# Patient Record
Sex: Male | Born: 1965 | Hispanic: Yes | Marital: Married | Smoking: Former smoker
Health system: Southern US, Community
[De-identification: ages and names within clinical notes are randomized; demographics above are authoritative.]

## PROBLEM LIST (undated history)

## (undated) DIAGNOSIS — E119 Type 2 diabetes mellitus without complications: Secondary | ICD-10-CM

## (undated) HISTORY — PX: CARPAL TUNNEL RELEASE: SHX101

---

## 2013-10-20 ENCOUNTER — Ambulatory Visit: Payer: Self-pay | Admitting: Orthopedic Surgery

## 2013-10-22 ENCOUNTER — Ambulatory Visit: Payer: Self-pay | Admitting: Orthopedic Surgery

## 2013-11-03 ENCOUNTER — Ambulatory Visit: Payer: Self-pay | Admitting: Orthopedic Surgery

## 2014-12-14 ENCOUNTER — Emergency Department: Payer: Self-pay | Admitting: Emergency Medicine

## 2014-12-19 ENCOUNTER — Emergency Department: Payer: Self-pay | Admitting: Student

## 2015-02-25 NOTE — Op Note (Signed)
PATIENT NAME:  [name], Ian MR#:  [id number] DATE OF BIRTH:  October 02, 1966  DATE OF PROCEDURE:  10/22/2013  PREOPERATIVE DIAGNOSES: Right carpal tunnel syndrome, right ring trigger finger.   POSTOPERATIVE DIAGNOSES: Right carpal tunnel syndrome, right ring trigger finger.  PROCEDURE: Right carpal tunnel release, right ring trigger finger release.   ANESTHESIA: General.   SURGEON: Leitha SchullerMichael J. Stephonie Wilcoxen, M.D.   DESCRIPTION OF PROCEDURE: The patient was brought to the operating room and after adequate anesthesia was obtained, the right arm was prepped and draped in the usual sterile fashion with a tourniquet applied to the upper arm. After patient identification and timeout procedures were completed, the tourniquet was raised to 250 mmHg. An approximately 2.5 cm incision was made for the carpal tunnel release. Hemostasis being achieved with electrocautery. The transverse carpal ligament was identified and incised with a hemostat placed to protect the underlying structures. Release was carried out distally first and then proximally. In the proximal third of the canal, there appeared to be compression, and after release of this, there was good vascular blush to the nerve. There was moderate flexor tenosynovitis. No masses present. The wound was irrigated, and 10 mL of 0.5% Sensorcaine without epinephrine was infiltrated in the subcutaneous tissues. Next, attention was turned to the ring trigger finger. A 1.5 cm incision made over the A1 pulley. Subcutaneous tissue spread with retractors placed. The A1 pulley was identified. It seemed quite thickened. After release, it was opened with a scalpel first and then going proximal and distal, the finger was placed through a range of motion under direct visualization. There was some swelling of the tendon but no abrasion. There did not appear to be any locking with passive range of motion. Both wounds were irrigated and closed with simple interrupted 4-0 nylon skin sutures.  Xeroform, 4 x 4's, Webril and Ace wrap applied. Tourniquet time was 18 minutes at 250 mmHg. There were no complications. There was no specimen.   ____________________________ Leitha SchullerMichael J. Zeda Gangwer, MD mjm:gb D: 10/22/2013 21:02:59 ET T: 10/22/2013 21:36:04 ET JOB#: [id number]  cc: Leitha SchullerMichael J. Vivienne Sangiovanni, MD, <Dictator> Leitha SchullerMICHAEL J Stefany Starace MD ELECTRONICALLY SIGNED 10/23/2013 8:16

## 2015-02-25 NOTE — Op Note (Signed)
PATIENT NAME:  Ian Mcneil, Ian MR#:  [id number] DATE OF BIRTH:  1966/05/28  DATE OF PROCEDURE:  11/03/2013  PREOPERATIVE DIAGNOSIS: Left carpal tunnel syndrome.   POSTOPERATIVE DIAGNOSIS: Left carpal tunnel syndrome.   PROCEDURE: Left carpal tunnel release    ANESTHESIA: General.   SURGEON: Leitha SchullerMichael J. Duane Earnshaw, M.D.   DESCRIPTION OF PROCEDURE: The patient was brought to the operating room and after adequate anesthesia was obtained, the left arm was prepped and draped in the usual sterile fashion with a tourniquet applied to the upper arm. After patient identification and timeout procedures were completed, the tourniquet raised to 250 mmHg. An approximately 1 inch incision was made in line with the ring metacarpal. Skin and subcutaneous tissue spread and the transverse carpal ligament identified and incised with a hemostat underneath this to protect the underlying structures. Release was carried out distally and then proximally, distally until there was fat noted around the nerve and tendons consistent with lack of compression. Proximally, release was carried out to just proximal to the wrist flexion crease, at which point there was good vascular blush to the nerve. The compression appeared to be in the proximal extent of the carpal tunnel. The wound was irrigated and then the wound infiltrated with 10 mL of 0.5% Sensorcaine without epinephrine to aid in postop analgesia in the subcutaneous tissue. The wound was then closed with simple interrupted 5-0 nylon skin suture. Xeroform, 4 x 4's, Webril and Ace wrap applied. The patient was sent to the recovery room in stable condition.   ESTIMATED BLOOD LOSS: Minimal.   COMPLICATIONS: None.   SPECIMEN: None.   TOURNIQUET TIME: 11 minutes at 250 mmHg.   ____________________________ Leitha SchullerMichael J. Anacristina Steffek, MD mjm:gb D: 11/03/2013 16:37:47 ET T: 11/03/2013 20:04:10 ET JOB#: [id number]  cc: Leitha SchullerMichael J. Gareth Fitzner, MD, <Dictator> Leitha SchullerMICHAEL J Ramona Ruark MD ELECTRONICALLY SIGNED  11/04/2013 12:15

## 2019-12-25 ENCOUNTER — Emergency Department: Payer: BC Managed Care – PPO

## 2019-12-25 ENCOUNTER — Emergency Department
Admission: EM | Admit: 2019-12-25 | Discharge: 2019-12-25 | Disposition: A | Discharge status: Other Acute Inpatient Hospital | Payer: BC Managed Care – PPO | Attending: Internal Medicine | Admitting: Internal Medicine

## 2019-12-25 ENCOUNTER — Other Ambulatory Visit: Payer: Self-pay

## 2019-12-25 ENCOUNTER — Encounter: Payer: Self-pay | Admitting: Emergency Medicine

## 2019-12-25 ENCOUNTER — Encounter (HOSPITAL_COMMUNITY): Payer: Self-pay | Admitting: Internal Medicine

## 2019-12-25 ENCOUNTER — Inpatient Hospital Stay (HOSPITAL_COMMUNITY)
Admission: AD | Admit: 2019-12-25 | Discharge: 2019-12-31 | DRG: 177 | Disposition: A | Discharge status: Home / Self Care | Payer: BC Managed Care – PPO | Source: Other Acute Inpatient Hospital | Attending: Family Medicine | Admitting: Family Medicine

## 2019-12-25 DIAGNOSIS — Z6833 Body mass index (BMI) 33.0-33.9, adult: Secondary | ICD-10-CM

## 2019-12-25 DIAGNOSIS — Z87891 Personal history of nicotine dependence: Secondary | ICD-10-CM

## 2019-12-25 DIAGNOSIS — J9601 Acute respiratory failure with hypoxia: Secondary | ICD-10-CM

## 2019-12-25 DIAGNOSIS — E669 Obesity, unspecified: Secondary | ICD-10-CM | POA: Diagnosis not present

## 2019-12-25 DIAGNOSIS — I10 Essential (primary) hypertension: Secondary | ICD-10-CM | POA: Diagnosis present

## 2019-12-25 DIAGNOSIS — U071 COVID-19: Principal | ICD-10-CM | POA: Diagnosis present

## 2019-12-25 DIAGNOSIS — E871 Hypo-osmolality and hyponatremia: Secondary | ICD-10-CM

## 2019-12-25 DIAGNOSIS — T380X5A Adverse effect of glucocorticoids and synthetic analogues, initial encounter: Secondary | ICD-10-CM | POA: Diagnosis present

## 2019-12-25 DIAGNOSIS — E119 Type 2 diabetes mellitus without complications: Secondary | ICD-10-CM

## 2019-12-25 DIAGNOSIS — J1282 Pneumonia due to coronavirus disease 2019: Secondary | ICD-10-CM | POA: Diagnosis present

## 2019-12-25 DIAGNOSIS — E1165 Type 2 diabetes mellitus with hyperglycemia: Secondary | ICD-10-CM | POA: Insufficient documentation

## 2019-12-25 DIAGNOSIS — D6959 Other secondary thrombocytopenia: Secondary | ICD-10-CM | POA: Diagnosis present

## 2019-12-25 DIAGNOSIS — R0602 Shortness of breath: Secondary | ICD-10-CM | POA: Diagnosis present

## 2019-12-25 HISTORY — DX: Type 2 diabetes mellitus without complications: E11.9

## 2019-12-25 LAB — COMPREHENSIVE METABOLIC PANEL WITH GFR
ALT: 111 U/L — ABNORMAL HIGH (ref 0–44)
AST: 157 U/L — ABNORMAL HIGH (ref 15–41)
Albumin: 3.6 g/dL (ref 3.5–5.0)
Alkaline Phosphatase: 71 U/L (ref 38–126)
Anion gap: 11 (ref 5–15)
BUN: 17 mg/dL (ref 6–20)
CO2: 19 mmol/L — ABNORMAL LOW (ref 22–32)
Calcium: 8 mg/dL — ABNORMAL LOW (ref 8.9–10.3)
Chloride: 97 mmol/L — ABNORMAL LOW (ref 98–111)
Creatinine, Ser: 0.81 mg/dL (ref 0.61–1.24)
GFR calc Af Amer: >60 mL/min
GFR calc non Af Amer: >60 mL/min
Glucose, Bld: 408 mg/dL — ABNORMAL HIGH (ref 70–99)
Potassium: 4.1 mmol/L (ref 3.5–5.1)
Sodium: 127 mmol/L — ABNORMAL LOW (ref 135–145)
Total Bilirubin: 0.8 mg/dL (ref 0.3–1.2)
Total Protein: 7.5 g/dL (ref 6.5–8.1)

## 2019-12-25 LAB — C-REACTIVE PROTEIN: CRP: 5.8 mg/dL — ABNORMAL HIGH (ref <1.0)

## 2019-12-25 LAB — CBC WITH DIFFERENTIAL/PLATELET
Abs Immature Granulocytes: 0.02 10*3/uL (ref 0.00–0.07)
Basophils Absolute: 0 10*3/uL (ref 0.0–0.1)
Basophils Relative: 0 %
Eosinophils Absolute: 0 10*3/uL (ref 0.0–0.5)
Eosinophils Relative: 0 %
HCT: 46.2 % (ref 39.0–52.0)
Hemoglobin: 16.7 g/dL (ref 13.0–17.0)
Immature Granulocytes: 0 %
Lymphocytes Relative: 14 %
Lymphs Abs: 0.6 10*3/uL — ABNORMAL LOW (ref 0.7–4.0)
MCH: 33.1 pg (ref 26.0–34.0)
MCHC: 36.1 g/dL — ABNORMAL HIGH (ref 30.0–36.0)
MCV: 91.5 fL (ref 80.0–100.0)
Monocytes Absolute: 0.3 10*3/uL (ref 0.1–1.0)
Monocytes Relative: 6 %
Neutro Abs: 3.7 10*3/uL (ref 1.7–7.7)
Neutrophils Relative %: 80 %
Platelets: 147 10*3/uL — ABNORMAL LOW (ref 150–400)
RBC: 5.05 MIL/uL (ref 4.22–5.81)
RDW: 12.1 % (ref 11.5–15.5)
WBC: 4.6 10*3/uL (ref 4.0–10.5)
nRBC: 0 % (ref 0.0–0.2)

## 2019-12-25 LAB — URINALYSIS, COMPLETE (UACMP) WITH MICROSCOPIC
Bacteria, UA: NONE SEEN
Bilirubin Urine: NEGATIVE
Glucose, UA: >=500 mg/dL — AB
Hgb urine dipstick: NEGATIVE
Ketones, ur: NEGATIVE mg/dL
Leukocytes,Ua: NEGATIVE
Nitrite: NEGATIVE
Protein, ur: 30 mg/dL — AB
Specific Gravity, Urine: 1.033 — ABNORMAL HIGH (ref 1.005–1.030)
Squamous Epithelial / HPF: NONE SEEN (ref 0–5)
pH: 6 (ref 5.0–8.0)

## 2019-12-25 LAB — TROPONIN I (HIGH SENSITIVITY)
Troponin I (High Sensitivity): 7 ng/L
Troponin I (High Sensitivity): 8 ng/L (ref <18)

## 2019-12-25 LAB — FIBRINOGEN: Fibrinogen: 537 mg/dL — ABNORMAL HIGH (ref 210–475)

## 2019-12-25 LAB — GLUCOSE, CAPILLARY
Glucose-Capillary: 281 mg/dL — ABNORMAL HIGH (ref 70–99)
Glucose-Capillary: 250 mg/dL — ABNORMAL HIGH (ref 70–99)

## 2019-12-25 LAB — BRAIN NATRIURETIC PEPTIDE: B Natriuretic Peptide: 19 pg/mL (ref 0.0–100.0)

## 2019-12-25 LAB — FIBRIN DERIVATIVES D-DIMER (ARMC ONLY): Fibrin derivatives D-dimer (ARMC): 1520.1 ng/mL (FEU) — ABNORMAL HIGH (ref 0.00–499.00)

## 2019-12-25 LAB — PROCALCITONIN: Procalcitonin: 0.35 ng/mL

## 2019-12-25 LAB — LACTIC ACID, PLASMA
Lactic Acid, Venous: 1.9 mmol/L (ref 0.5–1.9)
Lactic Acid, Venous: 1.6 mmol/L (ref 0.5–1.9)

## 2019-12-25 LAB — LACTATE DEHYDROGENASE: LDH: 375 U/L — ABNORMAL HIGH (ref 98–192)

## 2019-12-25 LAB — TRIGLYCERIDES: Triglycerides: 129 mg/dL

## 2019-12-25 LAB — POC SARS CORONAVIRUS 2 AG: SARS Coronavirus 2 Ag: POSITIVE — AB

## 2019-12-25 LAB — FERRITIN: Ferritin: 2986 ng/mL — ABNORMAL HIGH (ref 24–336)

## 2019-12-25 MED ORDER — SODIUM CHLORIDE 0.9 % IV SOLN
1000.0000 mL | INTRAVENOUS | Status: DC
  Administered 2019-12-25: 14:00:00 1000 mL via INTRAVENOUS

## 2019-12-25 MED ORDER — INSULIN ASPART 100 UNIT/ML ~~LOC~~ SOLN: 0.0000 [IU] | Freq: Every day | SUBCUTANEOUS | Status: DC

## 2019-12-25 MED ORDER — IPRATROPIUM-ALBUTEROL 20-100 MCG/ACT IN AERS
1.0000 | INHALATION_SPRAY | Freq: Four times a day (QID) | RESPIRATORY_TRACT | Status: DC
  Administered 2019-12-25 – 2019-12-31 (×23): 1 via RESPIRATORY_TRACT
  Filled 2019-12-25 (×3): qty 4

## 2019-12-25 MED ORDER — DEXAMETHASONE 6 MG PO TABS
6.0000 mg | ORAL_TABLET | ORAL | Status: DC
  Administered 2019-12-26 – 2019-12-31 (×6): 6 mg via ORAL
  Filled 2019-12-25 (×6): qty 1

## 2019-12-25 MED ORDER — ZINC SULFATE 220 (50 ZN) MG PO CAPS
220.0000 mg | ORAL_CAPSULE | Freq: Every day | ORAL | Status: DC
  Administered 2019-12-25 – 2019-12-31 (×7): 220 mg via ORAL
  Filled 2019-12-25 (×7): qty 1

## 2019-12-25 MED ORDER — ENOXAPARIN SODIUM 40 MG/0.4ML ~~LOC~~ SOLN
40.0000 mg | SUBCUTANEOUS | Status: DC
  Administered 2019-12-25 – 2019-12-30 (×6): 40 mg via SUBCUTANEOUS
  Filled 2019-12-25 (×6): qty 0.4

## 2019-12-25 MED ORDER — ACETAMINOPHEN 325 MG PO TABS
650.0000 mg | ORAL_TABLET | Freq: Once | ORAL | Status: AC | PRN
  Administered 2019-12-25: 650 mg via ORAL
  Filled 2019-12-25: qty 2

## 2019-12-25 MED ORDER — ONDANSETRON HCL 4 MG/2ML IJ SOLN: 4.0000 mg | Freq: Four times a day (QID) | INTRAMUSCULAR | Status: DC | PRN

## 2019-12-25 MED ORDER — INSULIN ASPART 100 UNIT/ML ~~LOC~~ SOLN: 0.0000 [IU] | Freq: Three times a day (TID) | SUBCUTANEOUS | Status: DC

## 2019-12-25 MED ORDER — GUAIFENESIN-DM 100-10 MG/5ML PO SYRP
10.0000 mL | ORAL_SOLUTION | ORAL | Status: DC | PRN
  Administered 2019-12-28 – 2019-12-30 (×5): 10 mL via ORAL
  Filled 2019-12-25 (×5): qty 10

## 2019-12-25 MED ORDER — SODIUM CHLORIDE 0.9% FLUSH: 3.0000 mL | Freq: Once | INTRAVENOUS | Status: DC

## 2019-12-25 MED ORDER — INSULIN ASPART 100 UNIT/ML ~~LOC~~ SOLN
0.0000 [IU] | Freq: Three times a day (TID) | SUBCUTANEOUS | Status: DC
  Administered 2019-12-25: 17:00:00 8 [IU] via SUBCUTANEOUS
  Filled 2019-12-25: qty 1

## 2019-12-25 MED ORDER — ACETAMINOPHEN 325 MG PO TABS: 650.0000 mg | ORAL_TABLET | Freq: Four times a day (QID) | ORAL | Status: DC | PRN

## 2019-12-25 MED ORDER — DOCUSATE SODIUM 100 MG PO CAPS
100.0000 mg | ORAL_CAPSULE | Freq: Every day | ORAL | Status: DC
  Administered 2019-12-25 – 2019-12-31 (×7): 100 mg via ORAL
  Filled 2019-12-25 (×7): qty 1

## 2019-12-25 MED ORDER — INSULIN ASPART 100 UNIT/ML ~~LOC~~ SOLN
5.0000 [IU] | Freq: Once | SUBCUTANEOUS | Status: AC
  Administered 2019-12-25: 5 [IU] via INTRAVENOUS
  Filled 2019-12-25: qty 1

## 2019-12-25 MED ORDER — HYDROCOD POLST-CPM POLST ER 10-8 MG/5ML PO SUER
5.0000 mL | Freq: Two times a day (BID) | ORAL | Status: DC | PRN
  Administered 2019-12-26 – 2019-12-30 (×7): 5 mL via ORAL
  Filled 2019-12-25 (×7): qty 5

## 2019-12-25 MED ORDER — DEXAMETHASONE SODIUM PHOSPHATE 10 MG/ML IJ SOLN
10.0000 mg | Freq: Once | INTRAMUSCULAR | Status: AC
  Administered 2019-12-25: 13:00:00 10 mg via INTRAVENOUS
  Filled 2019-12-25: qty 1

## 2019-12-25 MED ORDER — ONDANSETRON HCL 4 MG PO TABS: 4.0000 mg | ORAL_TABLET | Freq: Four times a day (QID) | ORAL | Status: DC | PRN

## 2019-12-25 MED ORDER — SODIUM CHLORIDE 0.9 % IV SOLN
100.0000 mg | Freq: Every day | INTRAVENOUS | Status: AC
  Administered 2019-12-26 – 2019-12-29 (×4): 100 mg via INTRAVENOUS
  Filled 2019-12-25 (×4): qty 20

## 2019-12-25 MED ORDER — SODIUM CHLORIDE 0.9 % IV SOLN
100.0000 mg | Freq: Every day | INTRAVENOUS | Status: DC
  Filled 2019-12-25: qty 20

## 2019-12-25 MED ORDER — ASCORBIC ACID 500 MG PO TABS
500.0000 mg | ORAL_TABLET | Freq: Every day | ORAL | Status: DC
  Administered 2019-12-25 – 2019-12-31 (×7): 500 mg via ORAL
  Filled 2019-12-25 (×6): qty 1

## 2019-12-25 MED ORDER — SODIUM CHLORIDE 0.9 % IV SOLN
200.0000 mg | Freq: Once | INTRAVENOUS | Status: AC
  Administered 2019-12-25: 14:00:00 200 mg via INTRAVENOUS
  Filled 2019-12-25: qty 200

## 2019-12-25 NOTE — ED Notes (Signed)
Admitting MD is at the bedside.  Tele translator brought to bed also

## 2019-12-25 NOTE — Discharge Summary (Signed)
  Please see HNP for full details. DO Discharge 12/25/2019  Discharge diagnosis acute respiratory failure secondary to COVID-19 pneumonia hyperglycemia/type II diabetes uncontrolled Hyponatremia elevated LFTs  Patient has been transferred to [facility] for further management of COVID-19 pneumonia.  Patient is in agreement with th plan. Further management per [facility] physicians.

## 2019-12-25 NOTE — Progress Notes (Signed)
Report given to [facility], report called to [facility]. Patient updated on plan of care. In NAD at this time.

## 2019-12-25 NOTE — ED Provider Notes (Signed)
[facility] Emergency Department Provider Note    None    (approximate)  I have reviewed the triage vital signs and the nursing notes.   HISTORY  Chief Complaint Chest Pain    HPI Ian Mcneil is a 54 y.o. male no significant past medical history but does have a self-reported history of hyper tension.  Presents to the ER for evaluation of cough chest pain shortness of breath and fevers for the past 3 days.  Patient arrives to the ER febrile tachycardic hypoxic with dry nonproductive cough.  Does not have any known sick contacts.  No recent antibiotics.  Denies any nausea or vomiting.   History reviewed. No pertinent past medical history. No family history on file. Past Surgical History:  Procedure Laterality Date  . CARPAL TUNNEL RELEASE     There are no problems to display for this patient.     Prior to Admission medications   Not on File    Allergies Patient has no known allergies.    Social History Social History   Tobacco Use  . Smoking status: Former Research scientist (life sciences)  . Smokeless tobacco: Never Used  Substance Use Topics  . Alcohol use: Not Currently  . Drug use: Not Currently    Review of Systems Patient denies headaches, rhinorrhea, blurry vision, numbness, shortness of breath, chest pain, edema, cough, abdominal pain, nausea, vomiting, diarrhea, dysuria, fevers, rashes or hallucinations unless otherwise stated above in HPI. ____________________________________________   PHYSICAL EXAM:  VITAL SIGNS: Vitals:   12/25/19 1216 12/25/19 1300  BP:  124/77  Pulse:  92  Resp:    Temp:    SpO2: 92% 92%    Constitutional: Alert and oriented. Mild resp distress Eyes: Conjunctivae are normal.  Head: Atraumatic. Nose: No congestion/rhinnorhea. Mouth/Throat: Mucous membranes are moist.   Neck: No stridor. Painless ROM.  Cardiovascular: tachycardic rate, regular rhythm. Grossly normal heart sounds.  Good peripheral  circulation. Respiratory: Normal respiratory effort.  No retractions. Lungs CTAB. Gastrointestinal: Soft and nontender. No distention. No abdominal bruits. No CVA tenderness. Genitourinary:  Musculoskeletal: No lower extremity tenderness nor edema.  No joint effusions. Neurologic:  Normal speech and language. No gross focal neurologic deficits are appreciated. No facial droop Skin:  Skin is warm, dry and intact. No rash noted. Psychiatric: Mood and affect are normal. Speech and behavior are normal.  ____________________________________________   LABS (all labs ordered are listed, but only abnormal results are displayed)  Results for orders placed or performed during the hospital encounter of 12/25/19 (from the past 24 hour(s))  Lactic acid, plasma     Status: None   Collection Time: 12/25/19 12:12 PM  Result Value Ref Range   Lactic Acid, Venous 1.9 0.5 - 1.9 mmol/L  Comprehensive metabolic panel     Status: Abnormal   Collection Time: 12/25/19 12:12 PM  Result Value Ref Range   Sodium 127 (L) 135 - 145 mmol/L   Potassium 4.1 3.5 - 5.1 mmol/L   Chloride 97 (L) 98 - 111 mmol/L   CO2 19 (L) 22 - 32 mmol/L   Glucose, Bld 408 (H) 70 - 99 mg/dL   BUN 17 6 - 20 mg/dL   Creatinine, Ser 0.81 0.61 - 1.24 mg/dL   Calcium 8.0 (L) 8.9 - 10.3 mg/dL   Total Protein 7.5 6.5 - 8.1 g/dL   Albumin 3.6 3.5 - 5.0 g/dL   AST 157 (H) 15 - 41 U/L   ALT 111 (H) 0 - 44 U/L  Alkaline Phosphatase 71 38 - 126 U/L   Total Bilirubin 0.8 0.3 - 1.2 mg/dL   GFR calc non Af Amer >60 >60 mL/min   GFR calc Af Amer >60 >60 mL/min   Anion gap 11 5 - 15  CBC with Differential     Status: Abnormal   Collection Time: 12/25/19 12:12 PM  Result Value Ref Range   WBC 4.6 4.0 - 10.5 K/uL   RBC 5.05 4.22 - 5.81 MIL/uL   Hemoglobin 16.7 13.0 - 17.0 g/dL   HCT 09.3 23.5 - 57.3 %   MCV 91.5 80.0 - 100.0 fL   MCH 33.1 26.0 - 34.0 pg   MCHC 36.1 (H) 30.0 - 36.0 g/dL   RDW 22.0 25.4 - 27.0 %   Platelets 147 (L) 150 -  400 K/uL   nRBC 0.0 0.0 - 0.2 %   Neutrophils Relative % 80 %   Neutro Abs 3.7 1.7 - 7.7 K/uL   Lymphocytes Relative 14 %   Lymphs Abs 0.6 (L) 0.7 - 4.0 K/uL   Monocytes Relative 6 %   Monocytes Absolute 0.3 0.1 - 1.0 K/uL   Eosinophils Relative 0 %   Eosinophils Absolute 0.0 0.0 - 0.5 K/uL   Basophils Relative 0 %   Basophils Absolute 0.0 0.0 - 0.1 K/uL   Immature Granulocytes 0 %   Abs Immature Granulocytes 0.02 0.00 - 0.07 K/uL  Troponin I (High Sensitivity)     Status: None   Collection Time: 12/25/19 12:12 PM  Result Value Ref Range   Troponin I (High Sensitivity) 7 <18 ng/L   ____________________________________________  EKG My review and personal interpretation at Time: 12:07   Indication: sob  Rate: 125  Rhythm: sinus Axis: normal  Other: normal intervals, no stemi ____________________________________________  RADIOLOGY  I personally reviewed all radiographic images ordered to evaluate for the above acute complaints and reviewed radiology reports and findings.  These findings were personally discussed with the patient.  Please see medical record for radiology report.  ____________________________________________   PROCEDURES  Procedure(s) performed:  .Critical Care Performed by: Willy Eddy, MD Authorized by: Willy Eddy, MD   Critical care provider statement:    Critical care time (minutes):  35   Critical care time was exclusive of:  Separately billable procedures and treating other patients   Critical care was necessary to treat or prevent imminent or life-threatening deterioration of the following conditions:  Respiratory failure   Critical care was time spent personally by me on the following activities:  Development of treatment plan with patient or surrogate, discussions with consultants, evaluation of patient's response to treatment, examination of patient, obtaining history from patient or surrogate, ordering and performing treatments and  interventions, ordering and review of laboratory studies, ordering and review of radiographic studies, pulse oximetry, re-evaluation of patient's condition and review of old charts      Critical Care performed: yes ____________________________________________   INITIAL IMPRESSION / ASSESSMENT AND PLAN / ED COURSE  Pertinent labs & imaging results that were available during my care of the patient were reviewed by me and considered in my medical decision making (see chart for details).   DDX: Asthma, copd, COVID CHF, pna, ptx, malignancy, Pe, anemia   Darryl Willner is a 54 y.o. who presents to the ED with symptoms as described above febrile tachycardic hypoxic and in mild respiratory distress requiring supplemental oxygen.  Work-up does show evidence of COVID-19 infection.  Patient will require hospitalization given the acuity of his illness.  Have given Decadron as well as gentle IV fluids.  Have discussed with the patient and available family all diagnostics and treatments performed thus far and all questions were answered to the best of my ability. The patient demonstrates understanding and agreement with plan.      The patient was evaluated in Emergency Department today for the symptoms described in the history of present illness. He/she was evaluated in the context of the global COVID-19 pandemic, which necessitated consideration that the patient might be at risk for infection with the SARS-CoV-2 virus that causes COVID-19. Institutional protocols and algorithms that pertain to the evaluation of patients at risk for COVID-19 are in a state of rapid change based on information released by regulatory bodies including the CDC and federal and state organizations. These policies and algorithms were followed during the patient's care in the ED.  As part of my medical decision making, I reviewed the following data within the electronic MEDICAL RECORD NUMBER Nursing notes reviewed and incorporated, Labs  reviewed, notes from prior ED visits and [place] Controlled Substance Database   ____________________________________________   FINAL CLINICAL IMPRESSION(S) / ED DIAGNOSES  Final diagnoses:  Acute respiratory failure with hypoxia (HCC)      NEW MEDICATIONS STARTED DURING THIS VISIT:  New Prescriptions   No medications on file     Note:  This document was prepared using Dragon voice recognition software and may include unintentional dictation errors.    Willy Eddy, MD 12/25/19 1321

## 2019-12-25 NOTE — ED Triage Notes (Signed)
Pt presents to ED via POV with c/o CP and cough. Pt with noted strong cough in triage. Pt also c/o his pulse upon arrival to ED. Pt states he feels like his heart is racing.   Pt noted to be tachypneic and febrile upon arrival to ED.

## 2019-12-25 NOTE — H&P (Signed)
TRH H&P   Patient Demographics:    Ian Mcneil, is a 54 y.o. male  MRN: [id number]   DOB - February 05, 1966  Admit Date - 12/25/2019  Outpatient Primary MD for the patient is Patient, No Pcp Per  Patient coming from: [facility]  No chief complaint on file.     HPI:    Ian Mcneil  is a 54 y.o. male, with HTN, DM, obesity who presented to the [facility] with progressive shortness of breath, found hypoxic with sats in the 80s, started on supplemental oxygen and subsequently transferred to [facility] for management of respiratory failure with hypoxia secondary to SARS-CoV-2.  He was doing well until a few days ago he developed upper respiratory symptoms including cough, fever, generalized malaise and weakness.  Symptoms progressively worsened to include shortness of breath with minimal exertion prompting the ER presentation.  Cough is nonproductive.  He denies any orthopnea, PND, palpitations.  Currently not taking any medications as an outpatient.   Review of systems:  Review of Systems:  Constitutional: negative for anorexia, chills, fatigue or fevers HEENT: negative for earaches, epistaxis, or sore throat Respiratory: see HPI Cardiovascular: negative for chest pain, palpitations, or syncope GU: negative for dysuria, urinary frequency, urinary urgency, hematuria Gastrointestinal: negative for abdominal pain, constipation, diarrhea, nausea or vomiting Musculoskeletal: negative for arthralgias, back pain or myalgias Neurological: negative for dizziness, headaches or weakness Behavioral/Psych: negative for suicidal or homicidal ideation Skin:negative for rash Heme: negative for bruises Endo: negative for hair loss, weight gain/loss  With  Past History of the following :   Past Medical History:  Diagnosis Date  . Diabetes mellitus ([place])       Past Surgical History:  Procedure Laterality Date  . CARPAL TUNNEL RELEASE      Social History:   Social History   Tobacco Use  . Smoking status: Former Research scientist (life sciences)  . Smokeless tobacco: Never Used  Substance Use Topics  . Alcohol use: Not Currently     Family History :    History reviewed. No pertinent family history.   Home Medications:   Prior to Admission medications   Not on File     Allergies:    No Known Allergies   Physical Exam:   Vitals  Blood pressure 108/75, pulse 73, temperature 97.7 F (36.5 C), temperature source Oral, resp. rate 19,  height 5\' 3"  (1.6 m), weight 84.8 kg, SpO2 95 %.  Physical Exam   Constitutional - resting comfortably, no acute distress Eyes - pupils equal round and reactive to light and accomodation, extra ocular movements intact Nose - no gross deformity or drainage Mouth - no oral lesions noted Throat - no swelling or erythema Neck - supple, no JVD   CV - (+)S1S2, no murmurs  Resp -few crackles at the bases otherwise CTA bilaterally, no wheezing or crackles,  GI - (+)BS, soft, non-tender, non-distended Extrem - no clubbing, cyanosis, or peripheral edema Skin - no rashes or wounds Neuro - alert, aware, oriented to person/place/time  Psych - normal affect, no anxiety   Patient has Pressure Ulcer on Admission?: no   Data Review:    CBC Recent Labs  Lab 12/25/19 1212  WBC 4.6  HGB 16.7  HCT 46.2  PLT 147*  MCV 91.5  MCH 33.1  MCHC 36.1*  RDW 12.1  LYMPHSABS 0.6*  MONOABS 0.3  EOSABS 0.0  BASOSABS 0.0   ------------------------------------------------------------------------------------------------------------------  Chemistries  Recent Labs  Lab 12/25/19 1212  NA 127*  K 4.1  CL 97*  CO2 19*  GLUCOSE 408*  BUN 17  CREATININE 0.81  CALCIUM 8.0*  AST 157*  ALT 111*  ALKPHOS 71  BILITOT 0.8    ------------------------------------------------------------------------------------------------------------------ estimated creatinine clearance is 100.4 mL/min (by C-G formula based on SCr of 0.81 mg/dL). ------------------------------------------------------------------------------------------------------------------ No results for input(s): TSH, T4TOTAL, T3FREE, THYROIDAB in the last 72 hours.  Invalid input(s): FREET3  Coagulation profile No results for input(s): INR, PROTIME in the last 168 hours. ------------------------------------------------------------------------------------------------------------------- No results for input(s): DDIMER in the last 72 hours. -------------------------------------------------------------------------------------------------------------------  Cardiac Enzymes No results for input(s): CKMB, TROPONINI, MYOGLOBIN in the last 168 hours.  Invalid input(s): CK ------------------------------------------------------------------------------------------------------------------    Component Value Date/Time   BNP 19.0 12/25/2019 1327     ---------------------------------------------------------------------------------------------------------------  Urinalysis    Component Value Date/Time   COLORURINE YELLOW (A) 12/25/2019 1326   APPEARANCEUR CLEAR (A) 12/25/2019 1326   LABSPEC 1.033 (H) 12/25/2019 1326   PHURINE 6.0 12/25/2019 1326   GLUCOSEU >=500 (A) 12/25/2019 1326   HGBUR NEGATIVE 12/25/2019 1326   BILIRUBINUR NEGATIVE 12/25/2019 1326   KETONESUR NEGATIVE 12/25/2019 1326   PROTEINUR 30 (A) 12/25/2019 1326   NITRITE NEGATIVE 12/25/2019 1326   LEUKOCYTESUR NEGATIVE 12/25/2019 1326    ----------------------------------------------------------------------------------------------------------------   Imaging Results:    DG Chest 2 View  Result Date: 12/25/2019 CLINICAL DATA:  Chest pain and cough. EXAM: CHEST - 2 VIEW COMPARISON:  None.  FINDINGS: Lung volumes are low crowding of bronchovascular structures. Patchy airspace disease is present in the lung bases. Heart size is normal. No pneumothorax or pleural fluid. No acute or focal bony abnormality. IMPRESSION: Patchy bibasilar airspace disease could be due to atelectasis or pneumonia. Electronically Signed   By: 12/27/2019 M.D.   On: 12/25/2019 12:53     Assessment & Plan:    Principal Problem:   Acute hypoxemic respiratory failure due to severe acute respiratory syndrome coronavirus 2 (SARS-CoV-2) disease (HCC) Active Problems:   Uncontrolled diabetes mellitus with hyperglycemia (HCC)   Hyponatremia   Obesity (BMI 30.0-34.9)     Acute hypoxemic respiratory failure due to SARS-CoV-2 disease: Patient presented with progressive shortness of breath, generalized malaise, was found to be hypoxic with O2 sat of 88% on room air, tested positive for SARS-CoV-2, subsequently started on supplemental oxygen and transferred for further management. Date of Dx: 12/25/2019 Oxygen requirements: 4  LPM Antibiotics: Not indicated at this time Diuretics: Clinically euvolemic not indicated at this time Vitamin C and Zinc: Per protocol Remdesivir: Started on 2/19 Steroids: Started on 2/19 Actemra: Not given yet Convalescent Plasma: Not given yet    Uncontrolled diabetes mellitus with hyperglycemia: Admits to a history of diabetes taking any medications.  Place him on a diabetic diet.  Sliding scale insulin.  Follow-up CT.    Hyponatremia: Likely pseudohyponatremia secondary to hyperglycemia, corrected sodium diabetes 134.  Glycemic control    Obesity:Obesity affects all facets of care.  Likely secondary to excessive caloric intake.  Encourage patient to reduce caloric intake while increasing physical activity and engage in other lifestyle changes that may result in weight loss.    DVT Prophylaxis Lovenox  AM Labs Ordered, also please review Full Orders  Family Communication:  Admission, patients condition and plan of care including tests being ordered have been discussed with the patient who indicate understanding and agree with the plan and Code Status.  Code Status Full  Likely DC to  home  Condition GUARDED    Consults called: none    Admission status: Admit to inpatient    Time spent in minutes : 110   Coletta Memos M.D on 12/25/2019 at 9:42 PM  To page go to [email or web address] - password [facility]

## 2019-12-25 NOTE — Progress Notes (Signed)
Remdesivir - Pharmacy Brief Note   A/P:  Remdesivir 200 mg IVPB once followed by 100 mg IVPB daily x 4 days.   No evidence found of prior remdesivir treatment  Albina Billet, PharmD, BCPS Clinical Pharmacist 12/25/2019 1:48 PM

## 2019-12-25 NOTE — H&P (Signed)
[facility] [place] NAME: Ian Mcneil    MR#:  [id number]  DATE OF BIRTH:  [birth year]  DATE OF ADMISSION:  12/25/2019  PRIMARY CARE PHYSICIAN: No primary care provider on file.   REQUESTING/REFERRING PHYSICIAN: Dr. Quentin Cornwall  Patient coming from : home   CHIEF COMPLAINT:  fever cough since February 14  HISTORY OF PRESENT ILLNESS:  Ian Mcneil  is a 54 y.o. male with a known history of type II diabetes not taking any medication for many years comes to the emergency room with ongoing high-grade fever, general malaise, cough and shortness of breath  ED course: in the ER patient had fever 103, respiratory rate 30, tachycardia heart rate in the 105 and was found to be covered positive. Currently his saturations are 92 to 94% on 4 L nasal cannula oxygen. Patient received IV Decadron, Tylenol,remdesivir first dose  Lactic acid 1.9 CRP and other inflammatory markers still pending. Mild elevated LFTs. Patient denies any alcohol abuse or history of smoking  he used to smoke and drink in the past currently not on any medications works in a [employer]:  History reviewed. No pertinent past medical history.  PAST SURGICAL HISTOIRY:   Past Surgical History:  Procedure Laterality Date  . CARPAL TUNNEL RELEASE      SOCIAL HISTORY:   Social History   Tobacco Use  . Smoking status: Former Research scientist (life sciences)  . Smokeless tobacco: Never Used  Substance Use Topics  . Alcohol use: Not Currently    FAMILY HISTORY:  No family history on file.  DRUG ALLERGIES:  No Known Allergies  REVIEW OF SYSTEMS:  Review of Systems  Constitutional: Positive for fever and malaise/fatigue. Negative for chills and weight loss.  HENT: Negative for ear discharge, ear pain and nosebleeds.   Eyes: Negative for blurred vision, pain and discharge.  Respiratory: Positive for cough and shortness of breath. Negative for sputum production,  wheezing and stridor.   Cardiovascular: Negative for chest pain, palpitations, orthopnea and PND.  Gastrointestinal: Negative for abdominal pain, diarrhea, nausea and vomiting.  Genitourinary: Negative for frequency and urgency.  Musculoskeletal: Negative for back pain and joint pain.  Neurological: Negative for sensory change, speech change, focal weakness and weakness.  Psychiatric/Behavioral: Negative for depression and hallucinations. The patient is not nervous/anxious.      MEDICATIONS AT HOME:   Prior to Admission medications   Not on File      VITAL SIGNS:  Blood pressure 100/63, pulse (!) 105, temperature (!) 100.8 F (38.2 C), temperature source Oral, resp. rate (!) 25, height 5\' 3"  (1.6 m), weight 84.8 kg, SpO2 94 %.  PHYSICAL EXAMINATION:  GENERAL:  54 y.o.-year-old patient lying in the bed with mild acute distress.  EYES: Pupils equal, round, reactive to light and accommodation. No scleral icterus.  HEENT: Head atraumatic, normocephalic. Oropharynx and nasopharynx clear.  NECK:  Supple, no jugular venous distention. No thyroid enlargement, no tenderness.  LUNGS: Normal breath sounds bilaterally, no wheezing, rales,rhonchi or crepitation. No use of accessory muscles of respiration. No respiratory distress CARDIOVASCULAR: S1, S2 normal. No murmurs, rubs, or gallops. Tachycardia ABDOMEN: Soft, nontender, nondistended. Bowel sounds present. No organomegaly or mass.  EXTREMITIES: No pedal edema, cyanosis, or clubbing.  NEUROLOGIC: Cranial nerves II through XII are intact. Muscle strength 5/5 in all extremities. Sensation intact. Gait not checked.  PSYCHIATRIC: The patient is alert and oriented x 3.  SKIN: No obvious rash, lesion, or ulcer.  LABORATORY PANEL:   CBC Recent Labs  Lab 12/25/19 1212  WBC 4.6  HGB 16.7  HCT 46.2  PLT 147*   ------------------------------------------------------------------------------------------------------------------  Chemistries   Recent Labs  Lab 12/25/19 1212  NA 127*  K 4.1  CL 97*  CO2 19*  GLUCOSE 408*  BUN 17  CREATININE 0.81  CALCIUM 8.0*  AST 157*  ALT 111*  ALKPHOS 71  BILITOT 0.8   ------------------------------------------------------------------------------------------------------------------  Cardiac Enzymes No results for input(s): TROPONINI in the last 168 hours. ------------------------------------------------------------------------------------------------------------------  RADIOLOGY:  DG Chest 2 View  Result Date: 12/25/2019 CLINICAL DATA:  Chest pain and cough. EXAM: CHEST - 2 VIEW COMPARISON:  None. FINDINGS: Lung volumes are low crowding of bronchovascular structures. Patchy airspace disease is present in the lung bases. Heart size is normal. No pneumothorax or pleural fluid. No acute or focal bony abnormality. IMPRESSION: Patchy bibasilar airspace disease could be due to atelectasis or pneumonia. Electronically Signed   By: Drusilla Kanner M.D.   On: 12/25/2019 12:53    EKG:   Sinus tachycardia IMPRESSION AND PLAN:  Ian Mcneil  is a 54 y.o. male with a known history of type II diabetes not taking any medication for many years comes to the emergency room with ongoing high-grade fever, general malaise, cough and shortness of breath. He was found to have COVID pneumonia  1. Acute respiratory failure secondary to COVID pneumonia -came in with fever 103, tachycardia, tachypnea, chest x-ray positive for patchy bilateral infiltrates -currently 92 to 94% on 4 L nasal cannula oxygen -received IV Decadron and first dose of IV remedisvir -lactic acid negative, Pro calcitonin 0.35 -inflammatory markers pending -discussed with patient regarding transfer to [facility] for further management he is in agreement with the plan -Care link has been informed  2. Elevated LFTs//transaminitis -likely due to COVID/viral infection -continue to monitor LFTs -patient does not drink alcohol  3.  Type II diabetes, uncontrolled/hyperglycemia-- noncompliant to meds for several years -place patient on sliding scale insulin -patient can be started on Lantus given IV steroids and uncontrolled sugars -check A1c  4. Hyponatremia -likely due to elevated sugars and some dehydration from viral illness  5. DVT prophylaxis subcu Lovenox  Pt  is in agreement for transfer to [facility].  Family Communication : patient in the ER via video interpreter Consults : none Code Status : full DVT prophylaxis : Lovenox  TOTAL TIME TAKING CARE OF THIS PATIENT: **55* minutes.    Enedina Finner M.D  [facility]     CC: Primary care physician; No primary care provider on file.

## 2019-12-25 NOTE — Plan of Care (Signed)
Patient admitted for COVID symptom management from [facility]

## 2019-12-26 DIAGNOSIS — E669 Obesity, unspecified: Secondary | ICD-10-CM

## 2019-12-26 LAB — COMPREHENSIVE METABOLIC PANEL
ALT: 87 U/L — ABNORMAL HIGH (ref 0–44)
AST: 96 U/L — ABNORMAL HIGH (ref 15–41)
Albumin: 3.2 g/dL — ABNORMAL LOW (ref 3.5–5.0)
Alkaline Phosphatase: 67 U/L (ref 38–126)
Anion gap: 11 (ref 5–15)
BUN: 14 mg/dL (ref 6–20)
CO2: 21 mmol/L — ABNORMAL LOW (ref 22–32)
Calcium: 8.2 mg/dL — ABNORMAL LOW (ref 8.9–10.3)
Chloride: 102 mmol/L (ref 98–111)
Creatinine, Ser: 0.62 mg/dL (ref 0.61–1.24)
GFR calc Af Amer: >60 mL/min (ref >60)
GFR calc non Af Amer: >60 mL/min (ref >60)
Glucose, Bld: 269 mg/dL — ABNORMAL HIGH (ref 70–99)
Potassium: 4.1 mmol/L (ref 3.5–5.1)
Sodium: 134 mmol/L — ABNORMAL LOW (ref 135–145)
Total Bilirubin: 0.7 mg/dL (ref 0.3–1.2)
Total Protein: 6.9 g/dL (ref 6.5–8.1)

## 2019-12-26 LAB — CBC WITH DIFFERENTIAL/PLATELET
Abs Immature Granulocytes: 0.02 10*3/uL (ref 0.00–0.07)
Basophils Absolute: 0 10*3/uL (ref 0.0–0.1)
Basophils Relative: 0 %
Eosinophils Absolute: 0 10*3/uL (ref 0.0–0.5)
Eosinophils Relative: 0 %
HCT: 44.2 % (ref 39.0–52.0)
Hemoglobin: 15.6 g/dL (ref 13.0–17.0)
Immature Granulocytes: 1 %
Lymphocytes Relative: 28 %
Lymphs Abs: 0.9 10*3/uL (ref 0.7–4.0)
MCH: 32.8 pg (ref 26.0–34.0)
MCHC: 35.3 g/dL (ref 30.0–36.0)
MCV: 93.1 fL (ref 80.0–100.0)
Monocytes Absolute: 0.3 10*3/uL (ref 0.1–1.0)
Monocytes Relative: 9 %
Neutro Abs: 2 10*3/uL (ref 1.7–7.7)
Neutrophils Relative %: 62 %
Platelets: 157 10*3/uL (ref 150–400)
RBC: 4.75 MIL/uL (ref 4.22–5.81)
RDW: 12.1 % (ref 11.5–15.5)
WBC: 3.2 10*3/uL — ABNORMAL LOW (ref 4.0–10.5)
nRBC: 0 % (ref 0.0–0.2)

## 2019-12-26 LAB — FERRITIN: Ferritin: 2175 ng/mL — ABNORMAL HIGH (ref 24–336)

## 2019-12-26 LAB — GLUCOSE, CAPILLARY
Glucose-Capillary: 243 mg/dL — ABNORMAL HIGH (ref 70–99)
Glucose-Capillary: 363 mg/dL — ABNORMAL HIGH (ref 70–99)
Glucose-Capillary: 227 mg/dL — ABNORMAL HIGH (ref 70–99)

## 2019-12-26 LAB — HEMOGLOBIN A1C
Hgb A1c MFr Bld: 11 % — ABNORMAL HIGH (ref 4.8–5.6)
Hgb A1c MFr Bld: 11.1 % — ABNORMAL HIGH (ref 4.8–5.6)
Mean Plasma Glucose: 269 mg/dL
Mean Plasma Glucose: 271.87 mg/dL

## 2019-12-26 LAB — D-DIMER, QUANTITATIVE: D-Dimer, Quant: 1.19 ug/mL-FEU — ABNORMAL HIGH (ref 0.00–0.50)

## 2019-12-26 LAB — C-REACTIVE PROTEIN: CRP: 7 mg/dL — ABNORMAL HIGH (ref <1.0)

## 2019-12-26 MED ORDER — INSULIN ASPART 100 UNIT/ML ~~LOC~~ SOLN
0.0000 [IU] | Freq: Three times a day (TID) | SUBCUTANEOUS | Status: DC
  Administered 2019-12-26 (×2): 20 [IU] via SUBCUTANEOUS
  Administered 2019-12-26: 7 [IU] via SUBCUTANEOUS
  Administered 2019-12-27: 4 [IU] via SUBCUTANEOUS
  Administered 2019-12-27 – 2019-12-30 (×8): 7 [IU] via SUBCUTANEOUS
  Administered 2019-12-31: 4 [IU] via SUBCUTANEOUS
  Administered 2019-12-31: 0 [IU] via SUBCUTANEOUS
  Administered 2019-12-31: 17:00:00 11 [IU] via SUBCUTANEOUS

## 2019-12-26 MED ORDER — INSULIN GLARGINE 100 UNIT/ML ~~LOC~~ SOLN
10.0000 [IU] | Freq: Two times a day (BID) | SUBCUTANEOUS | Status: DC
  Administered 2019-12-26 (×2): 10 [IU] via SUBCUTANEOUS
  Filled 2019-12-26 (×3): qty 0.1

## 2019-12-26 MED ORDER — LINAGLIPTIN 5 MG PO TABS
5.0000 mg | ORAL_TABLET | Freq: Every day | ORAL | Status: DC
  Administered 2019-12-26 – 2019-12-31 (×6): 5 mg via ORAL
  Filled 2019-12-26 (×5): qty 1

## 2019-12-26 MED ORDER — INSULIN ASPART 100 UNIT/ML ~~LOC~~ SOLN
0.0000 [IU] | Freq: Every day | SUBCUTANEOUS | Status: DC
  Administered 2019-12-26 – 2019-12-27 (×2): 2 [IU] via SUBCUTANEOUS
  Administered 2019-12-28: 21:00:00 4 [IU] via SUBCUTANEOUS
  Administered 2019-12-29 – 2019-12-30 (×2): 2 [IU] via SUBCUTANEOUS

## 2019-12-26 NOTE — Progress Notes (Signed)
Inpatient Diabetes Program Recommendations  AACE/ADA: New Consensus Statement on Inpatient Glycemic Control   Target Ranges:  Prepandial:   less than 140 mg/dL      Peak postprandial:   less than 180 mg/dL (1-2 hours)      Critically ill patients:  140 - 180 mg/dL  Results for Ian Mcneil, Ian Mcneil (MRN [id number]) as of 12/26/2019 08:11  Ref. Range 12/26/2019 00:26  Glucose Latest Ref Range: 70 - 99 mg/dL 725 (H)   Results for Ian Mcneil, Ian Mcneil (MRN [id number]) as of 12/26/2019 08:11  Ref. Range 12/25/2019 16:54 12/25/2019 20:30  Glucose-Capillary Latest Ref Range: 70 - 99 mg/dL 425 (H) 956 (H)  Results for Ian Mcneil, Ian Mcneil (MRN [id number]) as of 12/26/2019 08:11  Ref. Range 12/25/2019 12:12  Glucose Latest Ref Range: 70 - 99 mg/dL 951 (H)  Hemoglobin O8C Latest Ref Range: 4.8 - 5.6 % 11.0 (H)   Review of Glycemic Control  Diabetes history: DM2 Outpatient Diabetes medications: None Current orders for Inpatient glycemic control: Lantus 10 units BID, Novolog 0-20 units TID with meals, Novolog 0-5 units QHS; Decadron 6 mg Q24H  Inpatient Diabetes Program Recommendations:   Insulin-Basal: Please consider changing from Lantus to Levemir 10 units BID (peak of Levemir tends to work better when steroids are ordered and BID dosing will allow for adjustments to be made sooner).  Oral DM medications: Please consider ordering Tradjenta 5 mg daily. DPP-4's have shown to reduce mortality in patients with DM2 and COVID.  NOTE: Please use COVID-19 Glycemic Control order set to order Levemir and Tradjenta as recommended above. Noted consult for Diabetes Coordinator. Diabetes Coordinator is not on campus over the weekend but available by pager from 8am to 5pm for questions or concerns. Patient noted to have DM2 hx and per chart has not taken DM medications for years. Patient has no insurance and A1C 11% on 12/25/19. Ordered TOC consult for assistance with follow up and medications if needed. Diabetes Coordinator will plan to speak  with patient when appropriate.   Thanks, Orlando Penner, RN, MSN, CDE Diabetes Coordinator Inpatient Diabetes Program [phone] (Team Pager from 8am to 5pm)

## 2019-12-26 NOTE — Progress Notes (Signed)
PROGRESS NOTE  Ian Mcneil  ZOX:[id number] DOB: 05/15/1966 DOA: 12/25/2019 PCP: Patient, No Pcp Per   Brief Narrative: Ian Mcneil is a 54 y.o. male with a history of T2DM, HTN, and obesity not taking medications who presented to [facility] ED 2/19 with 3 days of worsening persistent cough and shortness of breath found to be hypoxic, febrile, hypertensive, and hyperglycemic. CRP elevated to 5.8, troponin normal x2, BNP normal, PCT 0.35, lactic acid normal at 1.9. SARS-CoV-2 antigen was positive. Remdesivir and steroids started and the patient was admitted to [facility].   Assessment & Plan: Principal Problem:   Acute hypoxemic respiratory failure due to severe acute respiratory syndrome coronavirus 2 (SARS-CoV-2) disease (HCC) Active Problems:   Uncontrolled diabetes mellitus with hyperglycemia (HCC)   Hyponatremia  Acute hypoxemic respiratory failure due to covid-19 pneumonia: SARS-CoV-2 Ag positive on 2/19.  - Continue remdesivir x5 days (2/19 - 2/23) - Steroids x10 days - Vitamin C, zinc - Encourage OOB, IS, FV, and awake proning if able - Tylenol and antitussives prn - Continue airborne, contact precautions for 21 days from positive testing. - Check CBC w/diff, CMP, CRP daily - Enoxaparin prophylactic dose.  - Maintain euvolemia/net negative.  - Avoid NSAIDs   T2DM: Poorly controlled with hyperglycemia. HbA1c 11%.  - Start basal insulin, augment SSI, add mealtime coverage, continue HS coverage, anticipate need to increase doses. - Start linagliptin - Change to carb-modified diet - Diabetes coordinator consulted  LFT elevation: Improving since admission, more consistent with covid effect. No known hepatitis hx. - Continue monitoring while on remdesivir.  Obesity: Estimated body mass index is 33.13 kg/m as calculated from the following:   Height as of this encounter: 5\' 3"  (1.6 m).   Weight as of this encounter: 84.8 kg.   Hyponatremia: Improving.   Thrombocytopenia: POA, resolved.  Likely also covid effect.  DVT prophylaxis: Lovenox Code Status: Full Family Communication: None at bedside Disposition Plan: Patient likely to return home pending clinical course. Remains at high risk of clinical deterioration requiring steroids with concomitant close glucose monitoring and insulin management, IV remdesivir, as well as supplemental oxygen.  Consultants:   None  Procedures:   None  Antimicrobials:  Remdesivir 2/19 - 2/23   Subjective: Spanish video remote interpretor, 3/23 [phone], used throughout encounter. He feels shortness of breath is less than admission, improved with supplemental oxygen, associated with cough. No chest pain or other complaints.    Objective: Vitals:   12/25/19 1944 12/26/19 0009 12/26/19 0015 12/26/19 0358  BP: 108/75 112/74  109/64  Pulse: 73 78 74 70  Resp: 19 (!) 28 19 (!) 24  Temp: 97.7 F (36.5 C) 98.9 F (37.2 C)  98 F (36.7 C)  TempSrc: Oral Oral  Axillary  SpO2:  94% 94% 93%  Weight: 84.8 kg     Height: 5\' 3"  (1.6 m)       Intake/Output Summary (Last 24 hours) at 12/26/2019 0647 Last data filed at 12/26/2019 0013 Gross per 24 hour  Intake --  Output 1325 ml  Net -1325 ml   Filed Weights   12/25/19 1944  Weight: 84.8 kg   Gen: 54 y.o. male in no distress Pulm: Non-labored breathing 3L O2 at rest SpO2 91%. Diminished bilaterally.  CV: Regular rate and rhythm. No murmur, rub, or gallop. No JVD, no pedal edema. GI: Abdomen soft, non-tender, non-distended, with normoactive bowel sounds. No organomegaly or masses felt. Ext: Warm, no deformities Skin: No rashes, lesions or ulcers Neuro: Alert and oriented. No  focal neurological deficits. Psych: Judgement and insight appear normal. Mood & affect appropriate.   CBC: Recent Labs  Lab 12/25/19 1212 12/26/19 0026  WBC 4.6 3.2*  NEUTROABS 3.7 2.0  HGB 16.7 15.6  HCT 46.2 44.2  MCV 91.5 93.1  PLT 147* 338   Basic Metabolic Panel: Recent Labs  Lab  12/25/19 1212 12/26/19 0026  NA 127* 134*  K 4.1 4.1  CL 97* 102  CO2 19* 21*  GLUCOSE 408* 269*  BUN 17 14  CREATININE 0.81 0.62  CALCIUM 8.0* 8.2*   Liver Function Tests: Recent Labs  Lab 12/25/19 1212 12/26/19 0026  AST 157* 96*  ALT 111* 87*  ALKPHOS 71 67  BILITOT 0.8 0.7  PROT 7.5 6.9  ALBUMIN 3.6 3.2*   HbA1C: Recent Labs    12/25/19 1212  HGBA1C 11.0*   CBG: Recent Labs  Lab 12/25/19 1654 12/25/19 2030  GLUCAP 281* 250*   Lipid Profile: Recent Labs    12/25/19 1326  TRIG 129   Anemia Panel: Recent Labs    12/25/19 1326 12/26/19 0026  FERRITIN 2,986* 2,175*   Urine analysis:    Component Value Date/Time   COLORURINE YELLOW (A) 12/25/2019 1326   APPEARANCEUR CLEAR (A) 12/25/2019 1326   LABSPEC 1.033 (H) 12/25/2019 1326   PHURINE 6.0 12/25/2019 1326   GLUCOSEU >=500 (A) 12/25/2019 1326   HGBUR NEGATIVE 12/25/2019 1326   BILIRUBINUR NEGATIVE 12/25/2019 1326   [place] 12/25/2019 1326   PROTEINUR 30 (A) 12/25/2019 1326   NITRITE NEGATIVE 12/25/2019 1326   LEUKOCYTESUR NEGATIVE 12/25/2019 1326   Radiology Studies: DG Chest 2 View  Result Date: 12/25/2019 CLINICAL DATA:  Chest pain and cough. EXAM: CHEST - 2 VIEW COMPARISON:  None. FINDINGS: Lung volumes are low crowding of bronchovascular structures. Patchy airspace disease is present in the lung bases. Heart size is normal. No pneumothorax or pleural fluid. No acute or focal bony abnormality. IMPRESSION: Patchy bibasilar airspace disease could be due to atelectasis or pneumonia. Electronically Signed   By: Inge Rise M.D.   On: 12/25/2019 12:53     LOS: 1 day   Time spent: 35 minutes.  Patrecia Pour, MD [facility] [email or web address] 12/26/2019, 6:47 AM

## 2019-12-27 LAB — COMPREHENSIVE METABOLIC PANEL
ALT: 61 U/L — ABNORMAL HIGH (ref 0–44)
AST: 45 U/L — ABNORMAL HIGH (ref 15–41)
Albumin: 3 g/dL — ABNORMAL LOW (ref 3.5–5.0)
Alkaline Phosphatase: 65 U/L (ref 38–126)
Anion gap: 8 (ref 5–15)
BUN: 17 mg/dL (ref 6–20)
CO2: 24 mmol/L (ref 22–32)
Calcium: 8.4 mg/dL — ABNORMAL LOW (ref 8.9–10.3)
Chloride: 105 mmol/L (ref 98–111)
Creatinine, Ser: 0.69 mg/dL (ref 0.61–1.24)
GFR calc Af Amer: >60 mL/min (ref >60)
GFR calc non Af Amer: >60 mL/min (ref >60)
Glucose, Bld: 233 mg/dL — ABNORMAL HIGH (ref 70–99)
Potassium: 3.9 mmol/L (ref 3.5–5.1)
Sodium: 137 mmol/L (ref 135–145)
Total Bilirubin: 0.6 mg/dL (ref 0.3–1.2)
Total Protein: 6.1 g/dL — ABNORMAL LOW (ref 6.5–8.1)

## 2019-12-27 LAB — CBC WITH DIFFERENTIAL/PLATELET
Abs Immature Granulocytes: 0.04 10*3/uL (ref 0.00–0.07)
Basophils Absolute: 0 10*3/uL (ref 0.0–0.1)
Basophils Relative: 0 %
Eosinophils Absolute: 0 10*3/uL (ref 0.0–0.5)
Eosinophils Relative: 0 %
HCT: 43 % (ref 39.0–52.0)
Hemoglobin: 14.9 g/dL (ref 13.0–17.0)
Immature Granulocytes: 1 %
Lymphocytes Relative: 22 %
Lymphs Abs: 1.4 10*3/uL (ref 0.7–4.0)
MCH: 32.6 pg (ref 26.0–34.0)
MCHC: 34.7 g/dL (ref 30.0–36.0)
MCV: 94.1 fL (ref 80.0–100.0)
Monocytes Absolute: 0.5 10*3/uL (ref 0.1–1.0)
Monocytes Relative: 8 %
Neutro Abs: 4.7 10*3/uL (ref 1.7–7.7)
Neutrophils Relative %: 69 %
Platelets: 187 10*3/uL (ref 150–400)
RBC: 4.57 MIL/uL (ref 4.22–5.81)
RDW: 12.4 % (ref 11.5–15.5)
WBC: 6.7 10*3/uL (ref 4.0–10.5)
nRBC: 0 % (ref 0.0–0.2)

## 2019-12-27 LAB — GLUCOSE, CAPILLARY
Glucose-Capillary: 209 mg/dL — ABNORMAL HIGH (ref 70–99)
Glucose-Capillary: 217 mg/dL — ABNORMAL HIGH (ref 70–99)
Glucose-Capillary: 181 mg/dL — ABNORMAL HIGH (ref 70–99)
Glucose-Capillary: 242 mg/dL — ABNORMAL HIGH (ref 70–99)

## 2019-12-27 LAB — FERRITIN: Ferritin: 1444 ng/mL — ABNORMAL HIGH (ref 24–336)

## 2019-12-27 LAB — D-DIMER, QUANTITATIVE: D-Dimer, Quant: 0.82 ug/mL-FEU — ABNORMAL HIGH (ref 0.00–0.50)

## 2019-12-27 LAB — C-REACTIVE PROTEIN: CRP: 2.9 mg/dL — ABNORMAL HIGH (ref <1.0)

## 2019-12-27 LAB — HIV ANTIBODY (ROUTINE TESTING W REFLEX): HIV Screen 4th Generation wRfx: NONREACTIVE — AB

## 2019-12-27 MED ORDER — INSULIN ASPART 100 UNIT/ML ~~LOC~~ SOLN
6.0000 [IU] | Freq: Three times a day (TID) | SUBCUTANEOUS | Status: DC
  Administered 2019-12-27 – 2019-12-28 (×4): 6 [IU] via SUBCUTANEOUS

## 2019-12-27 MED ORDER — INSULIN GLARGINE 100 UNIT/ML ~~LOC~~ SOLN
20.0000 [IU] | Freq: Two times a day (BID) | SUBCUTANEOUS | Status: DC
  Administered 2019-12-27 – 2019-12-28 (×3): 20 [IU] via SUBCUTANEOUS
  Filled 2019-12-27 (×3): qty 0.2

## 2019-12-27 NOTE — Progress Notes (Signed)
PROGRESS NOTE  Ian Mcneil  GDJ:[id number] DOB: 1965-12-01 DOA: 12/25/2019 PCP: Patient, No Pcp Per   Brief Narrative: Ian Mcneil is a 54 y.o. male with a history of T2DM, HTN, and obesity not taking medications who presented to [facility] ED 2/19 with 3 days of worsening persistent cough and shortness of breath found to be hypoxic, febrile, hypertensive, and hyperglycemic. CRP elevated to 5.8, troponin normal x2, BNP normal, PCT 0.35, lactic acid normal at 1.9. SARS-CoV-2 antigen was positive. Remdesivir and steroids were started and the patient was admitted to [facility]. Hypoxemia has continued and inflammatory markers have shown improvement.  Assessment & Plan: Principal Problem:   Acute hypoxemic respiratory failure due to severe acute respiratory syndrome coronavirus 2 (SARS-CoV-2) disease (HCC) Active Problems:   Uncontrolled diabetes mellitus with hyperglycemia (HCC)   Hyponatremia   Obesity (BMI 30.0-34.9)  Acute hypoxemic respiratory failure due to covid-19 pneumonia: SARS-CoV-2 Ag positive on 2/19.  - Continue remdesivir x5 days (2/19 - 2/23) - Steroids x10 days. CRP improving 7 > 2.9.  - Vitamin C, zinc - Encourage OOB, IS - prn antitussives - Continue airborne, contact precautions for 21 days from positive testing. - Enoxaparin prophylactic dose. D-dimer declining.  T2DM: Poorly controlled with hyperglycemia exacerbated by steroid-induced hyperglycemia. HbA1c 11%.  - Started basal insulin, will increase 10u BID > 20u BID, add mealtime novolog 6u TIDWC, continue resistant SSI, continue HS coverage, anticipate need to increase doses. - Start linagliptin - Change to carb-modified diet - Diabetes coordinator consulted  LFT elevation: Improving since admission, more consistent with covid effect. No known hepatitis hx. - Continue monitoring daily while on remdesivir.  Obesity: Estimated body mass index is 33.13 kg/m as calculated from the following:   Height as of this encounter: 5\' 3"   (1.6 m).   Weight as of this encounter: 84.8 kg.   Hyponatremia: Resolved  Thrombocytopenia: POA, resolved. Likely also covid effect.  DVT prophylaxis: Lovenox Code Status: Full Family Communication: None at bedside Disposition Plan: Patient likely to return home pending clinical course. Remains at high risk of clinical deterioration requiring steroids with concomitant close glucose monitoring and aggressive insulin titration, IV remdesivir, as well as supplemental oxygen.  Consultants:   None  Procedures:   None  Antimicrobials:  Remdesivir 2/19 - 2/23   Subjective: Spanish video remote 3/23 [phone], used throughout encounter. Patient reports sustained improvement in shortness of breath that is intermittent, mild, associated with exertion like when getting up to bathroom/throw away trash, and improved with oxygen. Oxygen down to 2LPM this AM. No chest pain but still has persistent cough,k nonproductive improved with liquid antitussive.   Objective: Vitals:   12/26/19 1705 12/26/19 1940 12/27/19 0400 12/27/19 0727  BP: 105/74 116/79 96/62 (!) 89/59  Pulse: 74 61 (!) 58 (!) 58  Resp: 16 16 18 16   Temp: 98 F (36.7 C) 98.7 F (37.1 C) 97.7 F (36.5 C) 98.1 F (36.7 C)  TempSrc: Oral Oral Oral Oral  SpO2: 90% 94% 94% 94%  Weight:      Height:        Intake/Output Summary (Last 24 hours) at 12/27/2019 0744 Last data filed at 12/26/2019 1341 Gross per 24 hour  Intake 1080 ml  Output 1225 ml  Net -145 ml   Filed Weights   12/25/19 1944  Weight: 84.8 kg   Gen: 54 y.o. male in no distress Pulm: Nonlabored breathing 2L O2, borderline tachypnea at rest. Diminished with scant crackles bilaterally. CV: Regular borderline bradycardia. No murmur,  rub, or gallop. No JVD, no dependent edema. GI: Abdomen soft, non-tender, non-distended, with normoactive bowel sounds.  Ext: Warm, no deformities Skin: No rashes, lesions or ulcers on visualized skin. Neuro:  Alert and oriented. No focal neurological deficits. Psych: Judgement and insight appear fair. Mood euthymic & affect congruent. Behavior is appropriate.    CBC: Recent Labs  Lab 12/25/19 1212 12/26/19 0026 12/27/19 0249  WBC 4.6 3.2* 6.7  NEUTROABS 3.7 2.0 4.7  HGB 16.7 15.6 14.9  HCT 46.2 44.2 43.0  MCV 91.5 93.1 94.1  PLT 147* 157 948   Basic Metabolic Panel: Recent Labs  Lab 12/25/19 1212 12/26/19 0026 12/27/19 0249  NA 127* 134* 137  K 4.1 4.1 3.9  CL 97* 102 105  CO2 19* 21* 24  GLUCOSE 408* 269* 233*  BUN 17 14 17   CREATININE 0.81 0.62 0.69  CALCIUM 8.0* 8.2* 8.4*   Liver Function Tests: Recent Labs  Lab 12/25/19 1212 12/26/19 0026 12/27/19 0249  AST 157* 96* 45*  ALT 111* 87* 61*  ALKPHOS 71 67 65  BILITOT 0.8 0.7 0.6  PROT 7.5 6.9 6.1*  ALBUMIN 3.6 3.2* 3.0*   HbA1C: Recent Labs    12/25/19 1212 12/26/19 0026  HGBA1C 11.0* 11.1*   CBG: Recent Labs  Lab 12/25/19 1654 12/25/19 2030 12/26/19 0735 12/26/19 1211 12/26/19 2108  GLUCAP 281* 250* 243* 363* 227*   Lipid Profile: Recent Labs    12/25/19 1326  TRIG 129   Anemia Panel: Recent Labs    12/26/19 0026 12/27/19 0249  FERRITIN 2,175* 1,444*   Urine analysis:    Component Value Date/Time   COLORURINE YELLOW (A) 12/25/2019 1326   APPEARANCEUR CLEAR (A) 12/25/2019 1326   LABSPEC 1.033 (H) 12/25/2019 1326   PHURINE 6.0 12/25/2019 1326   GLUCOSEU >=500 (A) 12/25/2019 1326   HGBUR NEGATIVE 12/25/2019 1326   [place] 12/25/2019 1326   KETONESUR NEGATIVE 12/25/2019 1326   PROTEINUR 30 (A) 12/25/2019 1326   NITRITE NEGATIVE 12/25/2019 1326   LEUKOCYTESUR NEGATIVE 12/25/2019 1326   Radiology Studies: DG Chest 2 View  Result Date: 12/25/2019 CLINICAL DATA:  Chest pain and cough. EXAM: CHEST - 2 VIEW COMPARISON:  None. FINDINGS: Lung volumes are low crowding of bronchovascular structures. Patchy airspace disease is present in the lung bases. Heart size is normal. No  pneumothorax or pleural fluid. No acute or focal bony abnormality. IMPRESSION: Patchy bibasilar airspace disease could be due to atelectasis or pneumonia. Electronically Signed   By: Inge Rise M.D.   On: 12/25/2019 12:53     LOS: 2 days   Time spent: 35 minutes.  Patrecia Pour, MD [facility] [email or web address] 12/27/2019, 7:44 AM

## 2019-12-28 LAB — CBC WITH DIFFERENTIAL/PLATELET
Abs Immature Granulocytes: 0.04 10*3/uL (ref 0.00–0.07)
Basophils Absolute: 0 10*3/uL (ref 0.0–0.1)
Basophils Relative: 0 %
Eosinophils Absolute: 0 10*3/uL (ref 0.0–0.5)
Eosinophils Relative: 0 %
HCT: 42.1 % (ref 39.0–52.0)
Hemoglobin: 14.9 g/dL (ref 13.0–17.0)
Immature Granulocytes: 1 %
Lymphocytes Relative: 23 %
Lymphs Abs: 1.5 10*3/uL (ref 0.7–4.0)
MCH: 33.3 pg (ref 26.0–34.0)
MCHC: 35.4 g/dL (ref 30.0–36.0)
MCV: 94 fL (ref 80.0–100.0)
Monocytes Absolute: 0.5 10*3/uL (ref 0.1–1.0)
Monocytes Relative: 7 %
Neutro Abs: 4.4 10*3/uL (ref 1.7–7.7)
Neutrophils Relative %: 69 %
Platelets: 201 10*3/uL (ref 150–400)
RBC: 4.48 MIL/uL (ref 4.22–5.81)
RDW: 12.5 % (ref 11.5–15.5)
WBC: 6.4 10*3/uL (ref 4.0–10.5)
nRBC: 0 % (ref 0.0–0.2)

## 2019-12-28 LAB — COMPREHENSIVE METABOLIC PANEL
ALT: 52 U/L — ABNORMAL HIGH (ref 0–44)
AST: 40 U/L (ref 15–41)
Albumin: 3 g/dL — ABNORMAL LOW (ref 3.5–5.0)
Alkaline Phosphatase: 59 U/L (ref 38–126)
Anion gap: 8 (ref 5–15)
BUN: 16 mg/dL (ref 6–20)
CO2: 24 mmol/L (ref 22–32)
Calcium: 8.2 mg/dL — ABNORMAL LOW (ref 8.9–10.3)
Chloride: 103 mmol/L (ref 98–111)
Creatinine, Ser: 0.65 mg/dL (ref 0.61–1.24)
GFR calc Af Amer: >60 mL/min (ref >60)
GFR calc non Af Amer: >60 mL/min (ref >60)
Glucose, Bld: 171 mg/dL — ABNORMAL HIGH (ref 70–99)
Potassium: 3.6 mmol/L (ref 3.5–5.1)
Sodium: 135 mmol/L (ref 135–145)
Total Bilirubin: 0.4 mg/dL (ref 0.3–1.2)
Total Protein: 6 g/dL — ABNORMAL LOW (ref 6.5–8.1)

## 2019-12-28 LAB — GLUCOSE, CAPILLARY
Glucose-Capillary: 292 mg/dL — ABNORMAL HIGH (ref 70–99)
Glucose-Capillary: 361 mg/dL — ABNORMAL HIGH (ref 70–99)
Glucose-Capillary: 107 mg/dL — ABNORMAL HIGH (ref 70–99)
Glucose-Capillary: 206 mg/dL — ABNORMAL HIGH (ref 70–99)
Glucose-Capillary: 248 mg/dL — ABNORMAL HIGH (ref 70–99)
Glucose-Capillary: 324 mg/dL — ABNORMAL HIGH (ref 70–99)

## 2019-12-28 LAB — FERRITIN: Ferritin: 1175 ng/mL — ABNORMAL HIGH (ref 24–336)

## 2019-12-28 LAB — C-REACTIVE PROTEIN: CRP: 1.5 mg/dL — ABNORMAL HIGH (ref <1.0)

## 2019-12-28 LAB — D-DIMER, QUANTITATIVE: D-Dimer, Quant: 0.66 ug/mL-FEU — ABNORMAL HIGH (ref 0.00–0.50)

## 2019-12-28 MED ORDER — INSULIN GLARGINE 100 UNIT/ML ~~LOC~~ SOLN
22.0000 [IU] | Freq: Two times a day (BID) | SUBCUTANEOUS | Status: DC
  Administered 2019-12-28 – 2019-12-29 (×2): 22 [IU] via SUBCUTANEOUS
  Filled 2019-12-28 (×3): qty 0.22

## 2019-12-28 MED ORDER — LIVING WELL WITH DIABETES BOOK - IN SPANISH
Freq: Once | Status: AC
  Filled 2019-12-28: qty 1

## 2019-12-28 MED ORDER — BENZONATATE 100 MG PO CAPS
100.0000 mg | ORAL_CAPSULE | Freq: Three times a day (TID) | ORAL | Status: DC
  Administered 2019-12-28 – 2019-12-31 (×11): 100 mg via ORAL
  Filled 2019-12-28 (×11): qty 1

## 2019-12-28 MED ORDER — INSULIN ASPART 100 UNIT/ML ~~LOC~~ SOLN
8.0000 [IU] | Freq: Three times a day (TID) | SUBCUTANEOUS | Status: DC
  Administered 2019-12-28 (×2): 8 [IU] via SUBCUTANEOUS

## 2019-12-28 NOTE — Evaluation (Signed)
Physical Therapy Evaluation Patient Details Name: Ian Mcneil MRN: [id number] DOB: [birth year] Today's Date: 12/28/2019   History of Present Illness  54 year old male admitted as transfer form [facility] ED on 2/19. PMH includes DM II (uncontrolled with A1C of 11) and HTN but had been taking no medications  Clinical Impression  He was in [facility] chair when PT arrived. On O2 per [place], 3 LPM. He denies any pain although he coughs frequently and states that his chest is "tight"- He is Spanish speaking- and Stratus Translator unit is in his room. He does speak and understand simple, basic [place]. He is married with 4 children, 2 of which live in the home- trailer. They have walk in shower and standard height commode. No other equipment. 2 outside steps with a handrail. He works full time in Programme researcher, broadcasting/film/video. He is generally weak- needs monitoring for BP (runs low) and O2 sats. Should benefit from PT for optimal functional outcomes.    Follow Up Recommendations No PT follow up    Equipment Recommendations       Recommendations for Other Services       Precautions / Restrictions Precautions Precautions: Fall Precaution Comments: Monitor O2 sats and BP as he tends to intermittently run low Restrictions Weight Bearing Restrictions: No      Mobility  Bed Mobility Overal bed mobility: Independent                Transfers Overall transfer level: Modified independent Equipment used: None                Ambulation/Gait Ambulation/Gait assistance: Supervision(to monitor O2 tubing- and will need if portable telemetry unit used) Gait Distance (Feet): 32 Feet Assistive device: None Gait Pattern/deviations: WFL(Within Functional Limits)   Gait velocity interpretation: 1.31 - 2.62 ft/sec, indicative of limited community Insurance account manager Rankin (Stroke Patients Only)       Balance Overall balance assessment: No apparent balance  deficits (not formally assessed)                                           Pertinent Vitals/Pain Pain Assessment: No/denies pain    Home Living Family/patient expects to be discharged to:: Private residence   Available Help at Discharge: Family Type of Home: (trailer) Home Access: Stairs to enter Entrance Stairs-Rails: Right Entrance Stairs-Number of Steps: 2 Home Layout: One level Home Equipment: None      Prior Function Level of Independence: Independent         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   Dominant Hand: Right    Extremity/Trunk Assessment        Lower Extremity Assessment Lower Extremity Assessment: Generalized weakness    Cervical / Trunk Assessment Cervical / Trunk Assessment: Normal  Communication   Communication: No difficulties(But might use Stratus translator - in room)  Cognition Arousal/Alertness: Awake/alert Behavior During Therapy: WFL for tasks assessed/performed Overall Cognitive Status: Within Functional Limits for tasks assessed                                 General Comments: He does speak /understand simple English- but Financial controller is in his room Spanish  is his primary language. He does cough and frequently rubs his chest, but states not pain, just "tight"      General Comments General comments (skin integrity, edema, etc.): Monitor O2 and BP    Exercises General Exercises - Upper Extremity Shoulder ABduction: AROM;Theraband;Seated Shoulder Horizontal ABduction: AROM;Seated;Theraband Theraband Level (Shoulder Horizontal Abduction): Level 1 (Yellow) Shoulder Horizontal ADduction: AROM;Seated;Theraband Theraband Level (Shoulder Horizontal Adduction): Level 1 (Yellow) Elbow Flexion: AROM Elbow Extension: AROM;Theraband;Seated General Exercises - Lower Extremity Ankle Circles/Pumps: AROM;Seated Hip ABduction/ADduction: AROM;Seated Straight Leg Raises:  AROM;Seated Hip Flexion/Marching: AROM;Seated Other Exercises Other Exercises: IS and Flutter Valve unit, with cues for proper use and good return demos   Achieved 750 with IS -   Assessment/Plan    PT Assessment Patient needs continued PT services  PT Problem List Decreased strength;Decreased activity tolerance;Decreased mobility       PT Treatment Interventions Gait training;Functional mobility training;Therapeutic activities;Therapeutic exercise;Patient/family education    PT Goals (Current goals can be found in the Care Plan section)  Acute Rehab PT Goals Patient Stated Goal: Just to get well and get home with my family PT Goal Formulation: With patient Time For Goal Achievement: 12/28/19 Potential to Achieve Goals: Good    Frequency Min 4X/week   Barriers to discharge        Co-evaluation               AM-PAC PT "6 Clicks" Mobility  Outcome Measure Help needed turning from your back to your side while in a flat bed without using bedrails?: None Help needed moving from lying on your back to sitting on the side of a flat bed without using bedrails?: None Help needed moving to and from a bed to a chair (including a wheelchair)?: None Help needed standing up from a chair using your arms (e.g., wheelchair or bedside chair)?: None Help needed to walk in hospital room?: A Little Help needed climbing 3-5 steps with a railing? : A Little 6 Click Score: 22    End of Session   Activity Tolerance: Patient tolerated treatment well Patient left: in chair Nurse Communication: Mobility status PT Visit Diagnosis: Muscle weakness (generalized) (M62.81)    Time: 8101-7510 PT Time Calculation (min) (ACUTE ONLY): 46 min   Charges:   PT Evaluation $PT Eval Moderate Complexity: 1 Mod PT Treatments $Therapeutic Exercise: 23-37 mins        Rollen Sox, PT # [phone] CGV cell  Casandra Doffing 12/28/2019, 10:20 AM

## 2019-12-28 NOTE — Plan of Care (Signed)
  Problem: Education: Goal: Knowledge of risk factors and measures for prevention of condition will improve Outcome: Progressing   Problem: Coping: Goal: Psychosocial and spiritual needs will be supported Outcome: Progressing   Problem: Respiratory: Goal: Will maintain a patent airway Outcome: Progressing Goal: Complications related to the disease process, condition or treatment will be avoided or minimized Outcome: Progressing   

## 2019-12-28 NOTE — Progress Notes (Signed)
PROGRESS NOTE  Ian Mcneil  LFY:[id number] DOB: 12-07-1965 DOA: 12/25/2019 PCP: Patient, No Pcp Per   Brief Narrative: Ian Mcneil is a 54 y.o. male with a history of T2DM, HTN, and obesity not taking medications who presented to [facility] ED 2/19 with 3 days of worsening persistent cough and shortness of breath found to be hypoxic, febrile, hypertensive, and hyperglycemic. CRP elevated to 5.8, troponin normal x2, BNP normal, PCT 0.35, lactic acid normal at 1.9. SARS-CoV-2 antigen was positive. Remdesivir and steroids were started and the patient was admitted to [facility]. Hypoxemia has continued and inflammatory markers have shown improvement.  Assessment & Plan: Principal Problem:   Acute hypoxemic respiratory failure due to severe acute respiratory syndrome coronavirus 2 (SARS-CoV-2) disease (HCC) Active Problems:   Uncontrolled diabetes mellitus with hyperglycemia (HCC)   Hyponatremia   Obesity (BMI 30.0-34.9)  Acute hypoxemic respiratory failure due to covid-19 pneumonia: SARS-CoV-2 Ag positive on 2/19.  - Continue remdesivir x5 days (2/19 - 2/23) - Steroids x10 days. CRP continues improvement - Vitamin C, zinc - Encourage OOB, IS - Continue antitussives, add tessalon - Continue airborne, contact precautions for 21 days from positive testing. - Enoxaparin prophylactic dose. D-dimer declining.  T2DM: Poorly controlled with hyperglycemia exacerbated by steroid-induced hyperglycemia. HbA1c 11%.  - Started basal insulin: Continue, increase to 22u BID. Added mealtime novolog 6u TIDWC, increase to 8u, continue resistant SSI, continue HS coverage, anticipate need to increase doses. - Start linagliptin - Change to carb-modified diet - Diabetes coordinator consulted  LFT elevation: Improving since admission, more consistent with covid effect. No known hepatitis hx. - Continue monitoring daily while on remdesivir. Continues improvement.  Obesity: Estimated body mass index is 33.13 kg/m as  calculated from the following:   Height as of this encounter: 5\' 3"  (1.6 m).   Weight as of this encounter: 84.8 kg.   Hyponatremia: Resolved  Thrombocytopenia: POA, resolved. Likely also covid effect.  DVT prophylaxis: Lovenox Code Status: Full Family Communication: None at bedside Disposition Plan: Patient likely to return home pending clinical course. Remains newly hypoxemic with covid pneumonia requiring IV steroids and remdesivir. Get PT/OT, quantify exertional hypoxemia, and consider DC 2/23.   Consultants:   None  Procedures:   None  Antimicrobials:  Remdesivir 2/19 - 2/23   Subjective: Spanish video remote interpretor, Ian Mcneil 3/23, used throughout encounter. The patient confirms ongoing dyspnea with exertion, but feels better today than yesterday. No chest pain or other complaints. Having soft BPs at night/early AM without dizziness, palpitations. Dyspnea is moderate on exertion, constant, gradually improved with rest, and associated with persistent cough which remains his primary complaint, a nonproductive cough.  Objective: Vitals:   12/27/19 1802 12/27/19 1933 12/28/19 0405 12/28/19 0736  BP:  90/70 102/70 (!) 89/58  Pulse: (!) 53 (!) 59 63 65  Resp:  20 18 20   Temp:  98.5 F (36.9 C) 98.3 F (36.8 C) 98.9 F (37.2 C)  TempSrc:  Oral Oral Oral  SpO2: (!) 89% 94% 95% 94%  Weight:      Height:        Intake/Output Summary (Last 24 hours) at 12/28/2019 0813 Last data filed at 12/27/2019 1300 Gross per 24 hour  Intake 1060 ml  Output --  Net 1060 ml   Filed Weights   12/25/19 1944  Weight: 84.8 kg   Gen: 54 y.o. male in no distress Pulm: Nonlabored breathing 2L O2, diminished diffusely, crackles present, no wheezes.. CV: Regular rate and rhythm. No murmur, rub, or gallop.  No JVD, no dependent edema. GI: Abdomen soft, non-tender, non-distended, with normoactive bowel sounds.  Ext: Warm, no deformities Skin: No rashes, lesions or ulcers on visualized  skin. Neuro: Alert and oriented. No focal neurological deficits. Psych: Judgement and insight appear fair. Mood euthymic & affect congruent. Behavior is appropriate.    CBC: Recent Labs  Lab 12/25/19 1212 12/26/19 0026 12/27/19 0249 12/28/19 0220  WBC 4.6 3.2* 6.7 6.4  NEUTROABS 3.7 2.0 4.7 4.4  HGB 16.7 15.6 14.9 14.9  HCT 46.2 44.2 43.0 42.1  MCV 91.5 93.1 94.1 94.0  PLT 147* 157 187 213   Basic Metabolic Panel: Recent Labs  Lab 12/25/19 1212 12/26/19 0026 12/27/19 0249 12/28/19 0220  NA 127* 134* 137 135  K 4.1 4.1 3.9 3.6  CL 97* 102 105 103  CO2 19* 21* 24 24  GLUCOSE 408* 269* 233* 171*  BUN 17 14 17 16   CREATININE 0.81 0.62 0.69 0.65  CALCIUM 8.0* 8.2* 8.4* 8.2*   Liver Function Tests: Recent Labs  Lab 12/25/19 1212 12/26/19 0026 12/27/19 0249 12/28/19 0220  AST 157* 96* 45* 40  ALT 111* 87* 61* 52*  ALKPHOS 71 67 65 59  BILITOT 0.8 0.7 0.6 0.4  PROT 7.5 6.9 6.1* 6.0*  ALBUMIN 3.6 3.2* 3.0* 3.0*   HbA1C: Recent Labs    12/25/19 1212 12/26/19 0026  HGBA1C 11.0* 11.1*   CBG: Recent Labs  Lab 12/26/19 2108 12/27/19 0727 12/27/19 1139 12/27/19 1606 12/27/19 1936  GLUCAP 227* 209* 217* 181* 242*   Lipid Profile: Recent Labs    12/25/19 1326  TRIG 129   Anemia Panel: Recent Labs    12/27/19 0249 12/28/19 0220  FERRITIN 1,444* 1,175*   Urine analysis:    Component Value Date/Time   COLORURINE YELLOW (A) 12/25/2019 1326   APPEARANCEUR CLEAR (A) 12/25/2019 1326   LABSPEC 1.033 (H) 12/25/2019 1326   PHURINE 6.0 12/25/2019 1326   GLUCOSEU >=500 (A) 12/25/2019 1326   HGBUR NEGATIVE 12/25/2019 1326   [place] 12/25/2019 1326   [place] 12/25/2019 1326   PROTEINUR 30 (A) 12/25/2019 1326   NITRITE NEGATIVE 12/25/2019 1326   [place] 12/25/2019 1326   Radiology Studies: No results found.   LOS: 3 days   Time spent: 35 minutes.  Patrecia Pour, MD [facility] [email or web address] 12/28/2019,  8:13 AM

## 2019-12-28 NOTE — Progress Notes (Signed)
Occupational Therapy Evaluation Patient Details Name: Ian Mcneil MRN: [id number] DOB: [birth year] Today's Date: 12/28/2019    History of Present Illness 54 year old male admitted as transfer form [facility] ED on 2/19. PMH includes DM II (uncontrolled with A1C of 11) and HTN but had been taking no medications   Clinical Impression   PTA pt resided with his family, independent in ADLs, IADLs, and mobility. Pt does not ambulate with an assistive device and reports 0 falls in the last 6 months. Pt does not use oxygen at home and is currently on 3.5L [place] (2 extensions). Pt currently independent to supervision for self-care and mobility tasks. Pt able to ambulate to/from bathroom with supervision to ensure safety with O2 tubing. Pt completed toileting task and tolerated standing ~3 min a the sink to complete grooming/hygiene tasks. SpO2 dropped to 87% during activity with pt requiring ~30 sec seated recovery to return to 90s. Educated pt on safety strategies and energy conservation techniques with handout provided. Titrated oxygen down to 2.5L  at end of session with SpO2 97%. RN updated. Pt demonstrates decreased strength, endurance, standing tolerance, and activity tolerance impacting ability to complete self-care and functional mobility tasks. Recommend skilled OT services to address above deficits in order to promote function and prevent further decline.     Follow Up Recommendations  No OT follow up    Equipment Recommendations  None recommended by OT    Recommendations for Other Services       Precautions / Restrictions Precautions Precautions: Other (comment) Precaution Comments: Monitor SpO2 Restrictions Weight Bearing Restrictions: No      Mobility Bed Mobility               General bed mobility comments: Pt seated in bedside chair upon OT arrival.  Transfers Overall transfer level: Independent Equipment used: None                  Balance Overall balance  assessment: No apparent balance deficits (not formally assessed)                                         ADL either performed or assessed with clinical judgement   ADL Overall ADL's : Needs assistance/impaired Eating/Feeding: Independent;Sitting   Grooming: Set up;Standing   Upper Body Bathing: Set up;Standing   Lower Body Bathing: Set up;Supervison/ safety;Sit to/from stand   Upper Body Dressing : Modified independent;Sitting   Lower Body Dressing: Supervision/safety;Sit to/from stand   Toilet Transfer: Supervision/safety;Ambulation;Regular Toilet   Toileting- Water quality scientist and Hygiene: Supervision/safety;Sit to/from stand       Functional mobility during ADLs: Supervision/safety General ADL Comments: Pt able to ambulate to/from bathroom without an assistive device. Noted 0 instances of LOB.      Vision Baseline Vision/History: Wears glasses Wears Glasses: Reading only       Perception     Praxis      Pertinent Vitals/Pain Pain Assessment: No/denies pain     Hand Dominance Right   Extremity/Trunk Assessment Upper Extremity Assessment Upper Extremity Assessment: Overall WFL for tasks assessed   Lower Extremity Assessment Lower Extremity Assessment: Defer to PT evaluation       Communication Communication Communication: Interpreter utilized(Interpreter [id number])   Cognition Arousal/Alertness: Awake/alert Behavior During Therapy: WFL for tasks assessed/performed Overall Cognitive Status: Within Functional Limits for tasks assessed  General Comments  Pt on 3.5L [place] (2 extensions) with SpO2 in 90s at rest. SpO2 dropped to 87% during activity with pt requiring ~30 sec to return to 90s. Titrated oxygen down to 2.5L at end of session with SpO2 97%.     Exercises     Shoulder Instructions      Home Living Family/patient expects to be discharged to:: Private residence Living  Arrangements: Spouse/significant other Available Help at Discharge: Family Type of Home: Mobile home Home Access: Stairs to enter Secretary/administrator of Steps: 2 Entrance Stairs-Rails: Right Home Layout: One level   Alternate Level Stairs-Rails: Right Bathroom Shower/Tub: Producer, television/film/video: Standard     Home Equipment: None          Prior Functioning/Environment Level of Independence: Independent        Comments: Pt independent in ADLs, IADLs, and mobility. Pt does not ambulate with an assistive device and reports 0 falls in the last 6 months. Pt still works and drives. Pt does not use oxygen at home.         OT Problem List: Decreased strength;Decreased activity tolerance;Cardiopulmonary status limiting activity      OT Treatment/Interventions: Self-care/ADL training;Therapeutic exercise;Neuromuscular education;Energy conservation;DME and/or AE instruction;Therapeutic activities;Patient/family education;Balance training    OT Goals(Current goals can be found in the care plan section) Acute Rehab OT Goals Patient Stated Goal: to go home Time For Goal Achievement: 01/11/20 Potential to Achieve Goals: Good ADL Goals Additional ADL Goal #1: Pt to complete ADLs independently with SpO2 maintaining in 90s throughout on room air. Additional ADL Goal #2: Pt to tolerate standing up to 10 min independently, in preparation for ADLs. Additional ADL Goal #3: Pt to recall and verbalize 3 energy conservation strategies with 0 verbal cues.  OT Frequency: Min 3X/week   Barriers to D/C:            Co-evaluation              AM-PAC OT "6 Clicks" Daily Activity     Outcome Measure Help from another person eating meals?: None Help from another person taking care of personal grooming?: A Little Help from another person toileting, which includes using toliet, bedpan, or urinal?: A Little Help from another person bathing (including washing, rinsing, drying)?: A  Little Help from another person to put on and taking off regular upper body clothing?: None Help from another person to put on and taking off regular lower body clothing?: A Little 6 Click Score: 20   End of Session Equipment Utilized During Treatment: Oxygen Nurse Communication: Mobility status  Activity Tolerance: Patient tolerated treatment well Patient left: in chair;with call bell/phone within reach  OT Visit Diagnosis: Muscle weakness (generalized) (M62.81)                Time: 0867-6195 OT Time Calculation (min): 22 min Charges:  OT General Charges $OT Visit: 1 Visit OT Evaluation $OT Eval Low Complexity: 1 Low  Peterson Ao OTR/L [phone]  Peterson Ao 12/28/2019, 3:26 PM

## 2019-12-28 NOTE — Progress Notes (Signed)
Inpatient Diabetes Program Recommendations  AACE/ADA: New Consensus Statement on Inpatient Glycemic Control   Target Ranges:  Prepandial:   less than 140 mg/dL      Peak postprandial:   less than 180 mg/dL (1-2 hours)      Critically ill patients:  140 - 180 mg/dL   Results for TABOR, DENHAM (MRN [id number]) as of 12/28/2019 12:43  Ref. Range 12/27/2019 07:27 12/27/2019 11:39 12/27/2019 16:06 12/27/2019 19:36 12/28/2019 07:34  Glucose-Capillary Latest Ref Range: 70 - 99 mg/dL 209 (H) 217 (H) 181 (H) 242 (H) 107 (H)   Review of Glycemic Control  Diabetes history: DM2 Outpatient Diabetes medications: None Current orders for Inpatient glycemic control: Lantus 22 units BID, Novolog 0-20 units TID with meals, Novolog 0-5 units QHS, Novolog 8 units TID with meals, Tradjenta 5 mg daily; Decadron 6 mg Q24H  Inpatient Diabetes Program Recommendations:   A1C: A1C 11% on 12/25/19 indicating an average glucose of 269 mg/dl.   NOTE: Spoke with patient over the phone with [place] interpreter [phone]) about diabetes and home regimen for diabetes control. Patient reports he does not have a PCP currently but notes he has [employer]. Patient states he does not have his insurance card, it is at home in his wallet so his wife has it. Patient states that he use to take DM medications years ago when he was in jail and he took the pills he was given until they expired and then he throw them out.   Patient is not sure of the name of the pills he took for DM. Patient reports that he has a glucometer and testing supplies at home but has not checked his glucose in years. Asked patient to check date on testing supplies to be sure they are not expired and if they are asked that he throw them away and get new testing supplies.  Discussed A1C results (11% on 12/25/19) and explained that current A1C indicates an average glucose of 269 mg/dl over the past 2-3 months. Discussed glucose and A1C goals. Discussed  importance of checking CBGs and maintaining good CBG control to prevent long-term and short-term complications. Explained how hyperglycemia leads to damage within blood vessels which lead to the common complications seen with uncontrolled diabetes. Stressed to the patient the importance of improving glycemic control to prevent further complications from uncontrolled diabetes. Discussed impact of nutrition, exercise, stress, sickness, and medications on diabetes control. Patient currently does not follow a Carb Modified diet and drinks sugary beverages (sweet tea, regular sodas, juice, Gatorade.  Encouraged patient to eliminate sugar from the beverages he is drinking.  Discussed carbohydrates, carbohydrate goals per day and meal, along with portion sizes. Consulted RD to further diet education. Discussed hypoglycemia along with treatment. Inquired if patient would be willing to take insulin at home if needed and patient states that he would take insulin if needed. Informed patient that if MD wants to discharge him on insulin, I will ask bedside RNs to educate him on insulin and allow him to self administer insulin. Informed patient that TOC is consulted and should be working with him to arrange follow up. Encouraged patient to start checking glucose as MD directs, to take DM medication as prescribed, to get a PCP, and to follow up with PCP consistently.   Patient verbalized understanding of information discussed and reports no further questions at this time related to diabetes. Sent chat to Dr. Bonner Puna to ask about discharge plan for outpatient DM management and patient  will be discharged on oral DM medication(s).   Thanks, Orlando Penner, RN, MSN, CDE Diabetes Coordinator Inpatient Diabetes Program [phone] (Team Pager)

## 2019-12-28 NOTE — Progress Notes (Signed)
Patient A/Ox4, sitting in chair, SpO2 93% on 2L [place], BP 89/58. Recheck 84/53. Patient has no complaints of dizziness or shortness of breath just hacking cough. MD notified of BP.  Morning medication given without complication. PRN robitussin given. Patient eating breakfast. Call light in reach.  0945: Physical therapy working with patient. Will monitor patients blood pressure when they are finished.  1015: Blood pressure remains stable 97/69 after completing physical therapy. Patient back in bed to rest. Tussionex given to help relieve cough.

## 2019-12-29 LAB — COMPREHENSIVE METABOLIC PANEL
ALT: 58 U/L — ABNORMAL HIGH (ref 0–44)
AST: 47 U/L — ABNORMAL HIGH (ref 15–41)
Albumin: 2.9 g/dL — ABNORMAL LOW (ref 3.5–5.0)
Alkaline Phosphatase: 62 U/L (ref 38–126)
Anion gap: 9 (ref 5–15)
BUN: 11 mg/dL (ref 6–20)
CO2: 23 mmol/L (ref 22–32)
Calcium: 8.3 mg/dL — ABNORMAL LOW (ref 8.9–10.3)
Chloride: 105 mmol/L (ref 98–111)
Creatinine, Ser: 0.53 mg/dL — ABNORMAL LOW (ref 0.61–1.24)
GFR calc Af Amer: >60 mL/min (ref >60)
GFR calc non Af Amer: >60 mL/min (ref >60)
Glucose, Bld: 101 mg/dL — ABNORMAL HIGH (ref 70–99)
Potassium: 3.5 mmol/L (ref 3.5–5.1)
Sodium: 137 mmol/L (ref 135–145)
Total Bilirubin: 0.6 mg/dL (ref 0.3–1.2)
Total Protein: 6.2 g/dL — ABNORMAL LOW (ref 6.5–8.1)

## 2019-12-29 LAB — GLUCOSE, CAPILLARY
Glucose-Capillary: 71 mg/dL (ref 70–99)
Glucose-Capillary: 249 mg/dL — ABNORMAL HIGH (ref 70–99)
Glucose-Capillary: 239 mg/dL — ABNORMAL HIGH (ref 70–99)
Glucose-Capillary: 241 mg/dL — ABNORMAL HIGH (ref 70–99)

## 2019-12-29 LAB — D-DIMER, QUANTITATIVE: D-Dimer, Quant: 0.83 ug/mL-FEU — ABNORMAL HIGH (ref 0.00–0.50)

## 2019-12-29 LAB — FERRITIN: Ferritin: 1008 ng/mL — ABNORMAL HIGH (ref 24–336)

## 2019-12-29 LAB — C-REACTIVE PROTEIN: CRP: 2.7 mg/dL — ABNORMAL HIGH (ref <1.0)

## 2019-12-29 MED ORDER — INSULIN GLARGINE 100 UNIT/ML ~~LOC~~ SOLN
20.0000 [IU] | Freq: Two times a day (BID) | SUBCUTANEOUS | Status: DC
  Administered 2019-12-29 – 2019-12-31 (×4): 20 [IU] via SUBCUTANEOUS
  Filled 2019-12-29 (×5): qty 0.2

## 2019-12-29 MED ORDER — INSULIN ASPART 100 UNIT/ML ~~LOC~~ SOLN
14.0000 [IU] | Freq: Three times a day (TID) | SUBCUTANEOUS | Status: DC
  Administered 2019-12-29: 14 [IU] via SUBCUTANEOUS
  Administered 2019-12-29: 6 [IU] via SUBCUTANEOUS
  Administered 2019-12-29 – 2019-12-30 (×4): 14 [IU] via SUBCUTANEOUS

## 2019-12-29 MED ORDER — METFORMIN HCL 500 MG PO TABS
500.0000 mg | ORAL_TABLET | Freq: Two times a day (BID) | ORAL | Status: DC
  Administered 2019-12-29 – 2019-12-31 (×6): 500 mg via ORAL
  Filled 2019-12-29 (×6): qty 1

## 2019-12-29 NOTE — Progress Notes (Signed)
PROGRESS NOTE  Ian Mcneil  FOY:[id number] DOB: 1966-09-29 DOA: 12/25/2019 PCP: Patient, No Pcp Per   Brief Narrative: Ian Mcneil is a 54 y.o. male with a history of T2DM, HTN, and obesity not taking medications who presented to [facility] ED 2/19 with 3 days of worsening persistent cough and shortness of breath found to be hypoxic, febrile, hypertensive, and hyperglycemic. CRP elevated to 5.8, troponin normal x2, BNP normal, PCT 0.35, lactic acid normal at 1.9. SARS-CoV-2 antigen was positive. Remdesivir and steroids were started and the patient was admitted to [facility]. Hypoxemia has continued and inflammatory markers have shown improvement.  Assessment & Plan: Principal Problem:   Acute hypoxemic respiratory failure due to severe acute respiratory syndrome coronavirus 2 (SARS-CoV-2) disease (HCC) Active Problems:   Uncontrolled diabetes mellitus with hyperglycemia (HCC)   Hyponatremia   Obesity (BMI 30.0-34.9)  Acute hypoxemic respiratory failure due to covid-19 pneumonia: SARS-CoV-2 Ag positive on 2/19.  - Complete remdesivir x5 days (2/19 - 2/23) - Steroids x10 days. CRP rising today. No leukocytosis previously or new fever.  - Vitamin C, zinc - Encourage OOB, IS - Continue antitussives, add tessalon - Continue airborne, contact precautions for 21 days from positive testing. - Enoxaparin prophylactic dose.    T2DM: Poorly controlled with hyperglycemia exacerbated by steroid-induced hyperglycemia. HbA1c 11%.  - Started basal insulin: Continue lantus 22u BID with fasting CBG at goal. Added mealtime novolog 8u TIDWC > increase to 14u TIDWC, continue resistant SSI, continue HS coverage. - Started linagliptin - CrCl is >176ml/min. Does not currently require contrasted study. Will start metformin to test for tolerance prior to discharge.  - Change to carb-modified diet. Dietitian consulted - Diabetes coordinator consulted  LFT elevation: Improving since admission, more consistent with covid  effect. No known hepatitis hx. - Continue monitoring daily while on remdesivir. Slightly up today compared to yesterday.   Obesity: Estimated body mass index is 33.13 kg/m as calculated from the following:   Height as of this encounter: 5\' 3"  (1.6 m).   Weight as of this encounter: 84.8 kg.   Hyponatremia: Resolved  Thrombocytopenia: POA, resolved. Likely also covid effect.  DVT prophylaxis: Lovenox Code Status: Full Family Communication: None at bedside Disposition Plan: Patient likely to return home pending clinical course. Remains newly hypoxemic with covid pneumonia requiring IV steroids and remdesivir. Also severely hyperglycemic, still requiring close glucose monitoring and insulin titration.  Consultants:   None  Procedures:   None  Antimicrobials:  Remdesivir 2/19 - 2/23   Subjective: Spanish video remote interpretor, 3/23 Rejeana Brock, used throughout encounter. Shortness of breath is mild at rest, moderate-severe on exertion limiting ambulation to bathroom/room yesterday. Cough is improving, but remains moderate nonproductive. No chest pain.  Objective: Vitals:   12/28/19 1219 12/28/19 1528 12/28/19 1932 12/29/19 0340  BP: (!) 100/57 96/71 96/61  101/71  Pulse:  (!) 58 (!) 58 65  Resp: 20 20 18 16   Temp: 98.7 F (37.1 C) 98.2 F (36.8 C) 98 F (36.7 C) 98.7 F (37.1 C)  TempSrc: Oral Oral Oral Oral  SpO2:   100% 100%  Weight:      Height:        Intake/Output Summary (Last 24 hours) at 12/29/2019 Last data filed at 12/28/2019 1845 Gross per 24 hour  Intake 800 ml  Output 480 ml  Net 320 ml   Filed Weights   12/25/19 1944  Weight: 84.8 kg   Gen: 54 y.o. male in no distress Pulm: Nonlabored breathing, on 2-3L, SpO2 low  90%'s at rest. Bilateral crackles without wheezes, stable. CV: Regular rate and rhythm. No murmur, rub, or gallop. No JVD, no dependent edema. GI: Abdomen soft, non-tender, non-distended, with normoactive bowel sounds.  Ext: Warm,  no deformities Skin: No rashes, lesions or ulcers on visualized skin. Neuro: Alert and oriented. No focal neurological deficits. Psych: Judgement and insight appear fair. Mood euthymic & affect congruent. Behavior is appropriate.    CBC: Recent Labs  Lab 12/25/19 1212 12/26/19 0026 12/27/19 0249 12/28/19 0220  WBC 4.6 3.2* 6.7 6.4  NEUTROABS 3.7 2.0 4.7 4.4  HGB 16.7 15.6 14.9 14.9  HCT 46.2 44.2 43.0 42.1  MCV 91.5 93.1 94.1 94.0  PLT 147* 157 187 962   Basic Metabolic Panel: Recent Labs  Lab 12/25/19 1212 12/26/19 0026 12/27/19 0249 12/28/19 0220 12/29/19 0231  NA 127* 134* 137 135 137  K 4.1 4.1 3.9 3.6 3.5  CL 97* 102 105 103 105  CO2 19* 21* 24 24 23   GLUCOSE 408* 269* 233* 171* 101*  BUN 17 14 17 16 11   CREATININE 0.81 0.62 0.69 0.65 0.53*  CALCIUM 8.0* 8.2* 8.4* 8.2* 8.3*   Liver Function Tests: Recent Labs  Lab 12/25/19 1212 12/26/19 0026 12/27/19 0249 12/28/19 0220 12/29/19 0231  AST 157* 96* 45* 40 47*  ALT 111* 87* 61* 52* 58*  ALKPHOS 71 67 65 59 62  BILITOT 0.8 0.7 0.6 0.4 0.6  PROT 7.5 6.9 6.1* 6.0* 6.2*  ALBUMIN 3.6 3.2* 3.0* 3.0* 2.9*   CBG: Recent Labs  Lab 12/27/19 1936 12/28/19 0734 12/28/19 1132 12/28/19 1524 12/28/19 2031  GLUCAP 242* 107* 206* 248* 324*   Anemia Panel: Recent Labs    12/28/19 0220 12/29/19 0231  FERRITIN 1,175* 1,008*   Urine analysis:    Component Value Date/Time   COLORURINE YELLOW (A) 12/25/2019 1326   APPEARANCEUR CLEAR (A) 12/25/2019 1326   LABSPEC 1.033 (H) 12/25/2019 1326   PHURINE 6.0 12/25/2019 1326   GLUCOSEU >=500 (A) 12/25/2019 1326   HGBUR NEGATIVE 12/25/2019 1326   [place] 12/25/2019 1326   [place] 12/25/2019 1326   PROTEINUR 30 (A) 12/25/2019 1326   NITRITE NEGATIVE 12/25/2019 1326   [place] 12/25/2019 1326   Radiology Studies: No results found.   LOS: 4 days   Time spent: 35 minutes.  Patrecia Pour, MD [facility] [email or web address] 12/29/2019, 8:14 AM

## 2019-12-29 NOTE — Plan of Care (Signed)
  Problem: Education: Goal: Knowledge of risk factors and measures for prevention of condition will improve Outcome: Progressing   Problem: Respiratory: Goal: Will maintain a patent airway Outcome: Progressing   

## 2019-12-29 NOTE — TOC Progression Note (Signed)
Transition of Care [facility]) - Progression Note    Patient Details  Name: Ian Mcneil MRN: [id number] Date of Birth: [birth year]  Transition of [facility]) CM/SW Contact  Armanda Heritage, RN Phone Number: 12/29/2019, 3:11 PM  Clinical Narrative:    CM spoke with patient who reports that he has medical insurance through [employer], but does not have his insurance card with him.  Patient also reports that he has not established pcp care in the past because he hasn't needed to see the doctor because he was now sick.  Patient is aware that he has a diagnosis of diabetes and will need pcp care now and has thought about establishing care with  [facility] clinic in [place].   The information for this clinic was places on AVS and patient is aware to call and schedule a pcp visit. Patient was also made aware that [employer] has a list of in-network providers which the patient can access by calling the customer service number on the back of his card if he wishes for additional pcp options to choose from.  Patient reports he uses a pharmacy in [place] and plans to pick up his prescriptions there at discharge.       Expected Discharge Plan: Home/Self Care Barriers to Discharge: Continued Medical Work up  Expected Discharge Plan and Services Expected Discharge Plan: Home/Self Care   Discharge Planning Services: CM Consult   Living arrangements for the past 2 months: Single Family Home                                       Social Determinants of Health (SDOH) Interventions    Readmission Risk Interventions No flowsheet data found.

## 2019-12-29 NOTE — Progress Notes (Signed)
Patient A/Ox4, sitting in chair, SpO2 100% on 2.5L [place]. Stable vital signs.  Patient has no complaints of dizziness or shortness of breath just cough at this time.  Morning medication given with no issues. . Robitussin given for cough. BS=71.  MD notified of Novolg scheduled dose to be given. Orders to give only 6 units instead of the scheduled 14.  Call light in reach. Will continue to monitor.  7078: patient working with OT. Hot tea given to help with cough. No relief at this point from Robitussin , will give Tussionex.

## 2019-12-29 NOTE — Progress Notes (Signed)
Occupational Therapy Treatment Patient Details Name: Ian Mcneil MRN: [id number] DOB: Aug 18, 1966 Today's Date: 12/29/2019    History of present illness 54 year old male admitted as transfer form [facility] ED on 2/19. PMH includes DM II (uncontrolled with A1C of 11) and HTN but had been taking no medications   OT comments  Patient completing bed mobility and transfers independently, and mobility with supervision. Patient had persistent dry, nonproductive cough throughout and complained of chest tightness but no pain.  He was initially on 2L O2 [place] with 2 extenders and SpO2 98 at rest.  When completing walk around unit patient SpO2 was 96 on 2L, and maintained >91 once switched to room air.  Patient toileted and completed standing grooming with supervision.  Patient doing well and making progress.  Would benefit from further OT to increase activity tolerance and education on energy conservation.   Follow Up Recommendations  No OT follow up    Equipment Recommendations  None recommended by OT    Recommendations for Other Services      Precautions / Restrictions Precautions Precautions: Other (comment) Precaution Comments: BP runs low Restrictions Weight Bearing Restrictions: No       Mobility Bed Mobility Overal bed mobility: Independent             General bed mobility comments: Patient was lying in bed  Transfers Overall transfer level: Independent Equipment used: None                  Balance Overall balance assessment: No apparent balance deficits (not formally assessed)                                         ADL either performed or assessed with clinical judgement   ADL Overall ADL's : Needs assistance/impaired     Grooming: Supervision/safety;Standing;Wash/dry hands;Wash/dry face       Lower Body Bathing: Administrator, Civil Service;Sit to/from stand           Toilet Transfer: Supervision/safety;Ambulation;Regular Toilet   Toileting-  Water quality scientist and Hygiene: Supervision/safety;Sit to/from stand       Functional mobility during ADLs: Supervision/safety       Vision       Perception     Praxis      Cognition Arousal/Alertness: Awake/alert Behavior During Therapy: WFL for tasks assessed/performed Overall Cognitive Status: Within Functional Limits for tasks assessed                                 General Comments: He does speak /understand simple [place]- but [employer] translator is in his room Spanish is his primary language. He does cough and frequently rubs his chest, but states not pain, just "tight"        Exercises Exercises: Other exercises Other Exercises Other Exercises: walk 250 ft   Shoulder Instructions       General Comments On 2.5 LPM, [place] and able to maintain SPO2 at 96%+ He did not desat when ambulating in room with continuous monitoring of O2    Pertinent Vitals/ Pain       Pain Assessment: No/denies pain  Home Living  Prior Functioning/Environment              Frequency  Min 3X/week        Progress Toward Goals  OT Goals(current goals can now be found in the care plan section)  Progress towards OT goals: Progressing toward goals  Acute Rehab OT Goals Patient Stated Goal: to go home Time For Goal Achievement: 01/11/20 Potential to Achieve Goals: Good  Plan Discharge plan remains appropriate    Co-evaluation                 AM-PAC OT "6 Clicks" Daily Activity     Outcome Measure   Help from another person eating meals?: None Help from another person taking care of personal grooming?: A Little Help from another person toileting, which includes using toliet, bedpan, or urinal?: A Little Help from another person bathing (including washing, rinsing, drying)?: A Little Help from another person to put on and taking off regular upper body clothing?: None Help from another person  to put on and taking off regular lower body clothing?: A Little 6 Click Score: 20    End of Session Equipment Utilized During Treatment: Oxygen  OT Visit Diagnosis: Muscle weakness (generalized) (M62.81)   Activity Tolerance Patient tolerated treatment well   Patient Left with call bell/phone within reach;in chair   Nurse Communication          Time: 5465-6812 OT Time Calculation (min): 31 min  Charges: OT General Charges $OT Visit: 1 Visit OT Treatments $Self Care/Home Management : 8-22 mins $Therapeutic Activity: 8-22 mins  Barbie Banner, OTR/L    Adella Hare 12/29/2019, 11:25 AM

## 2019-12-29 NOTE — Progress Notes (Signed)
Inpatient Diabetes Program Recommendations  AACE/ADA: New Consensus Statement on Inpatient Glycemic Control   Target Ranges:  Prepandial:   less than 140 mg/dL      Peak postprandial:   less than 180 mg/dL (1-2 hours)      Critically ill patients:  140 - 180 mg/dL   Results for Ian Mcneil, Ian Mcneil (MRN [id number]) as of 12/29/2019 11:14  Ref. Range 12/27/2019 07:27 12/27/2019 11:39 12/27/2019 16:06 12/27/2019 19:36 12/28/2019 07:34 12/28/2019 11:32 12/28/2019 15:24 12/28/2019 20:31 12/29/2019 07:30  Glucose-Capillary Latest Ref Range: 70 - 99 mg/dL 712 (H) 527 (H) 129 (H) 242 (H) 107 (H) 206 (H) 248 (H) 324 (H) 71   Review of Glycemic Control  Diabetes history:DM2 Outpatient Diabetes medications:None Current orders for Inpatient glycemic control:Lantus 22 units BID, Novolog 0-20 units TID with meals, Novolog 0-5 units QHS, Novolog 14 units TID with meals, Tradjenta 5 mg daily, Metformin 500 mg BID; Decadron 6 mg Q24H   Inpatient Diabetes Program Recommendations:   Insulin-Basal: Noted fasting glucose 71 mg/dl today at 2:90 am. May want to consider decreasing Lantus to 20 units BID.  Insulin-Meal Coverage: Noted meal coverage increased from Novolog 8 units to 14 units TID with meals (agreeable with increase).  Thanks, Orlando Penner, RN, MSN, CDE Diabetes Coordinator Inpatient Diabetes Program [phone] (Team Pager from 8am to 5pm)

## 2019-12-29 NOTE — Progress Notes (Signed)
Physical Therapy Treatment Patient Details Name: Ian Mcneil MRN: [id number] DOB: April 05, 1966 Today's Date: 12/29/2019    History of Present Illness 54 year old male admitted as transfer form [facility] ED on 2/19. PMH includes DM II (uncontrolled with A1C of 11) and HTN but had been taking no medications    PT Comments    When PT arrived, he had already gone back to bed from when OT had assisted him to the [facility] chair. When asked why he was lying in bed, that he needed to stay up, he said he was cold. Encouraged him to let the nurses know and he could get additional blankets. He was brought warmed blankets, following ambulating in room. He maintained SPO2 > 96%. Monitored continuously. He was left in BS chair. Covers in place. Practiced IS and Flutter Valve unit with good return demo. Reminded to perform all UE/LE ex daily in between PT sessions and the breathing exercisers hourly. Legs elevated, BS table over lap- remot/call light in reach. Should benefit from PT ongoing for optimal functional outcomes.   Follow Up Recommendations  No PT follow up     Equipment Recommendations       Recommendations for Other Services       Precautions / Restrictions Precautions Precautions: Other (comment) Precaution Comments: Monitor SpO2- and HR- intermittent increase HR to > 140-- recovers quickly Restrictions Weight Bearing Restrictions: No    Mobility  Bed Mobility Overal bed mobility: Independent             General bed mobility comments: Patient was lying in bed  Transfers Overall transfer level: Independent Equipment used: None                Ambulation/Gait Ambulation/Gait assistance: Supervision Gait Distance (Feet): 45 Feet(in room, maintaining 96%+) Assistive device: None Gait Pattern/deviations: WFL(Within Functional Limits)         Stairs             Wheelchair Mobility    Modified Rankin (Stroke Patients Only)       Balance Overall balance  assessment: No apparent balance deficits (not formally assessed)                                          Cognition Arousal/Alertness: Awake/alert Behavior During Therapy: WFL for tasks assessed/performed Overall Cognitive Status: Within Functional Limits for tasks assessed                                 General Comments: He does speak /understand simple [place]- but [employer] translator is in his room Spanish is his primary language. He does cough and frequently rubs his chest, but states not pain, just "tight"      Exercises Other Exercises Other Exercises: IS and Flutter Valve unit, with cues for proper use and good return demos   Achieved 1000 ML with IS - 10 reps each unit and remin    General Comments General comments (skin integrity, edema, etc.): On 2.5 LPM, [place] and able to maintain SPO2 at 96%+ He did not desat when ambulating in room with continuous monitoring of O2      Pertinent Vitals/Pain Pain Assessment: No/denies pain    Home Living  Prior Function            PT Goals (current goals can now be found in the care plan section) Acute Rehab PT Goals Patient Stated Goal: to go home PT Goal Formulation: With patient Time For Goal Achievement: 01/11/20(CORRECTION) Potential to Achieve Goals: Good Progress towards PT goals: Progressing toward goals    Frequency    Min 4X/week      PT Plan      Co-evaluation              AM-PAC PT "6 Clicks" Mobility   Outcome Measure  Help needed turning from your back to your side while in a flat bed without using bedrails?: None Help needed moving from lying on your back to sitting on the side of a flat bed without using bedrails?: None Help needed moving to and from a bed to a chair (including a wheelchair)?: None Help needed standing up from a chair using your arms (e.g., wheelchair or bedside chair)?: None Help needed to walk in hospital room?: A  Little Help needed climbing 3-5 steps with a railing? : A Little 6 Click Score: 22    End of Session   Activity Tolerance: Patient tolerated treatment well Patient left: in chair Nurse Communication: Mobility status PT Visit Diagnosis: Muscle weakness (generalized) (M62.81)     Time: 4782-9562 PT Time Calculation (min) (ACUTE ONLY): 34 min  Charges:  $Gait Training: 8-22 mins $Therapeutic Exercise: 8-22 mins                  Mathew Postiglione P, PT # [phone] CGV cell  Ephriam Jenkins 12/29/2019, 11:19 AM

## 2019-12-30 LAB — CBC WITH DIFFERENTIAL/PLATELET
Abs Immature Granulocytes: 0.1 10*3/uL — ABNORMAL HIGH (ref 0.00–0.07)
Basophils Absolute: 0 10*3/uL (ref 0.0–0.1)
Basophils Relative: 0 %
Eosinophils Absolute: 0.1 10*3/uL (ref 0.0–0.5)
Eosinophils Relative: 1 %
HCT: 44.9 % (ref 39.0–52.0)
Hemoglobin: 15.6 g/dL (ref 13.0–17.0)
Immature Granulocytes: 1 %
Lymphocytes Relative: 18 %
Lymphs Abs: 1.3 10*3/uL (ref 0.7–4.0)
MCH: 32.8 pg (ref 26.0–34.0)
MCHC: 34.7 g/dL (ref 30.0–36.0)
MCV: 94.5 fL (ref 80.0–100.0)
Monocytes Absolute: 0.4 10*3/uL (ref 0.1–1.0)
Monocytes Relative: 6 %
Neutro Abs: 5.5 10*3/uL (ref 1.7–7.7)
Neutrophils Relative %: 74 %
Platelets: 276 10*3/uL (ref 150–400)
RBC: 4.75 MIL/uL (ref 4.22–5.81)
RDW: 12.5 % (ref 11.5–15.5)
WBC: 7.4 10*3/uL (ref 4.0–10.5)
nRBC: 0 % (ref 0.0–0.2)

## 2019-12-30 LAB — COMPREHENSIVE METABOLIC PANEL
ALT: 48 U/L — ABNORMAL HIGH (ref 0–44)
AST: 34 U/L (ref 15–41)
Albumin: 2.9 g/dL — ABNORMAL LOW (ref 3.5–5.0)
Alkaline Phosphatase: 72 U/L (ref 38–126)
Anion gap: 9 (ref 5–15)
BUN: 11 mg/dL (ref 6–20)
CO2: 22 mmol/L (ref 22–32)
Calcium: 8.6 mg/dL — ABNORMAL LOW (ref 8.9–10.3)
Chloride: 102 mmol/L (ref 98–111)
Creatinine, Ser: 0.56 mg/dL — ABNORMAL LOW (ref 0.61–1.24)
GFR calc Af Amer: >60 mL/min (ref >60)
GFR calc non Af Amer: >60 mL/min (ref >60)
Glucose, Bld: 141 mg/dL — ABNORMAL HIGH (ref 70–99)
Potassium: 3.7 mmol/L (ref 3.5–5.1)
Sodium: 133 mmol/L — ABNORMAL LOW (ref 135–145)
Total Bilirubin: 0.6 mg/dL (ref 0.3–1.2)
Total Protein: 6.5 g/dL (ref 6.5–8.1)

## 2019-12-30 LAB — FERRITIN: Ferritin: 850 ng/mL — ABNORMAL HIGH (ref 24–336)

## 2019-12-30 LAB — C-REACTIVE PROTEIN: CRP: 5.8 mg/dL — ABNORMAL HIGH (ref <1.0)

## 2019-12-30 LAB — GLUCOSE, CAPILLARY
Glucose-Capillary: 108 mg/dL — ABNORMAL HIGH (ref 70–99)
Glucose-Capillary: 203 mg/dL — ABNORMAL HIGH (ref 70–99)
Glucose-Capillary: 208 mg/dL — ABNORMAL HIGH (ref 70–99)
Glucose-Capillary: 246 mg/dL — ABNORMAL HIGH (ref 70–99)

## 2019-12-30 LAB — CULTURE, BLOOD (ROUTINE X 2)
Culture: NO GROWTH
Culture: NO GROWTH
Special Requests: ADEQUATE

## 2019-12-30 LAB — PROCALCITONIN: Procalcitonin: <0.1 ng/mL

## 2019-12-30 LAB — D-DIMER, QUANTITATIVE: D-Dimer, Quant: 0.74 ug/mL-FEU — ABNORMAL HIGH (ref 0.00–0.50)

## 2019-12-30 MED ORDER — INSULIN STARTER KIT- SYRINGES (SPANISH)
1.0000 | Freq: Once | Status: AC
  Administered 2019-12-30: 1
  Filled 2019-12-30: qty 1

## 2019-12-30 MED ORDER — INSULIN ASPART 100 UNIT/ML ~~LOC~~ SOLN
16.0000 [IU] | Freq: Three times a day (TID) | SUBCUTANEOUS | Status: DC
  Administered 2019-12-31 (×3): 16 [IU] via SUBCUTANEOUS

## 2019-12-30 NOTE — Plan of Care (Signed)
  RD consulted for nutrition education regarding diabetes.  Spoke with pt via interpreter.  Pt reports that he had not checked his DM in a long time but knows now that he needs to get his medicine when he is discharged.  He used to drink a lot of Gatorade but knows he can switch to the no sugar Gatorade.   Pt states he is feeling much better and hopes to go home soon.   Lab Results  Component Value Date   HGBA1C 11.1 (H) 12/26/2019    RD provided "Carbohydrate Counting for People with Diabetes" handout from the Academy of Nutrition and Dietetics in Spanish. Discussed different food groups and their effects on blood sugar, emphasizing carbohydrate-containing foods. Provided list of carbohydrates and recommended serving sizes of common foods. Attached handout to AVS as well.   Discussed importance of controlled and consistent carbohydrate intake throughout the day. Provided examples of ways to balance meals/snacks and encouraged intake of high-fiber, whole grain complex carbohydrates. Teach back method used.  Expect fair compliance.  Body mass index is 33.13 kg/m. Pt meets criteria for obesity based on current BMI.  Current diet order is CHO Modified, patient is consuming approximately 100% of meals at this time. Labs and medications reviewed. No further nutrition interventions warranted at this time. RD contact information provided. If additional nutrition issues arise, please re-consult RD.  Cammy Copa., RD, LDN, CNSC See AMiON for contact information

## 2019-12-30 NOTE — Progress Notes (Signed)
Physical Therapy Treatment Patient Details Name: Ian Mcneil MRN: [id number] DOB: [birth year] Today's Date: 12/30/2019    History of Present Illness 54 year old male admitted as transfer form [facility] ED on 2/19. PMH includes DM II (uncontrolled with A1C of 11) and HTN but had been taking no medications    PT Comments    He was resting in bed when PT arrived. Explained we needed to walk, He immediately asked if he was going home. At rest on edge of bed, RA, he desatted to 87-88% briefly and fluctuated to 94%. After ambulating 50 feet, he dropped to 82%, and O2 was reapplied for 2 LPM, [place] for remainder of walk. Total distance 275 feet, and maintained above 93% on O2 . On return to room, he was left in BS chair, still on 2 LPM, [place] and O2 at 94%. Practiced IS and Flutter valve unit for 10 reps each, good return demo. Legs elevated, blankets in place as he felt very cold. Remote/call light and phone all in reach, and BS table adjacent to the [facility] chair also in reach. Should benefit from continued PT while hospitalized to progress toward optimal functional outcomes.   Follow Up Recommendations  No PT follow up     Equipment Recommendations       Recommendations for Other Services       Precautions / Restrictions Precautions Precautions: Other (comment) Precaution Comments: BP runs low- MONITOR O2 sats closely- tends to desat on RA with exertion Restrictions Weight Bearing Restrictions: No    Mobility  Bed Mobility Overal bed mobility: Independent             General bed mobility comments: Patient was lying in bed  Transfers Overall transfer level: Independent Equipment used: None                Ambulation/Gait Ambulation/Gait assistance: Supervision Gait Distance (Feet): 275 Feet Assistive device: None(Had to apply 2 LPM, [place] after first 50 feet of ambulation due to desats) Gait Pattern/deviations: WFL(Within Functional Limits)   Gait velocity interpretation: 1.31 - 2.62  ft/sec, indicative of limited community Industrial/product designer [place] (Stroke Patients Only)       Balance Overall balance assessment: No apparent balance deficits (not formally assessed)                                          Cognition Arousal/Alertness: Awake/alert Behavior During Therapy: WFL for tasks assessed/performed Overall Cognitive Status: Within Functional Limits for tasks assessed                                 General Comments: He does speak /understand simple English- but [employer] translator is in his room Spanish is his primary language. He does cough and frequently rubs his chest, but states not pain, just "tight"      Exercises Other Exercises Other Exercises: IS and Flutter Valve Unit practice, 10 reps each, and reminded to perform hourly Other Exercises: Reminded to perform strengthening ex , has printed sheet and theraband in room    General Comments General comments (skin integrity, edema, etc.): Initially sitting on edge of bed , SPO2 fluctuated between 88-91%, and after ambulating into hallway with monitoring  with portable telemetry, had to re-apply O2 at 2 lpm, [place] (50 feet into the walk, he began to desat)      Pertinent Vitals/Pain Pain Assessment: No/denies pain    Home Living                      Prior Function            PT Goals (current goals can now be found in the care plan section) Acute Rehab PT Goals Patient Stated Goal: to go home PT Goal Formulation: With patient Time For Goal Achievement: 01/11/20 Potential to Achieve Goals: Good Progress towards PT goals: Progressing toward goals    Frequency           PT Plan      Co-evaluation              AM-PAC PT "6 Clicks" Mobility   Outcome Measure  Help needed turning from your back to your side while in a flat bed without using bedrails?: None Help needed moving from lying  on your back to sitting on the side of a flat bed without using bedrails?: None Help needed moving to and from a bed to a chair (including a wheelchair)?: None Help needed standing up from a chair using your arms (e.g., wheelchair or bedside chair)?: None Help needed to walk in hospital room?: A Little Help needed climbing 3-5 steps with a railing? : A Little 6 Click Score: 22    End of Session   Activity Tolerance: Patient tolerated treatment well Patient left: in chair Nurse Communication: Mobility status PT Visit Diagnosis: Muscle weakness (generalized) (M62.81)     Time: 4481-8563 PT Time Calculation (min) (ACUTE ONLY): 34 min  Charges:  $Gait Training: 8-22 mins $Therapeutic Exercise: 8-22 mins                   Keisuke Hollabaugh P, PT # [phone] CGV cell  Casandra Doffing 12/30/2019, 11:17 AM

## 2019-12-30 NOTE — Progress Notes (Signed)
PROGRESS NOTE    Ian Mcneil  ERD:[id number] DOB: 1966-03-16 DOA: 12/25/2019 PCP: Patient, No Pcp Per   Brief Narrative:  Ian Mcneil is Ian Mcneil 54 y.o. male with Ian Mcneil history of T2DM, HTN, and obesity not taking medications who presented to [facility] ED 2/19 with 3 days of worsening persistent cough and shortness of breath found to be hypoxic, febrile, hypertensive, and hyperglycemic. CRP elevated to 5.8, troponin normal x2, BNP normal, PCT 0.35, lactic acid normal at 1.9. SARS-CoV-2 antigen was positive. Remdesivir and steroids were started and the patient was admitted to [facility]. Hypoxemia has continued and inflammatory markers have shown improvement.  Assessment & Plan:   Principal Problem:   Acute hypoxemic respiratory failure due to severe acute respiratory syndrome coronavirus 2 (SARS-CoV-2) disease (HCC) Active Problems:   Uncontrolled diabetes mellitus with hyperglycemia (HCC)   Hyponatremia   Obesity (BMI 30.0-34.9)  Acute hypoxemic respiratory failure due to covid-19 pneumonia: SARS-CoV-2 Ag positive on 2/19.  - Complete remdesivir x5 days (2/19 - 2/23) - Steroids x10 days. CRP rising again today. No leukocytosis previously or new fever.  Negative procalcitonin. - Vitamin C, zinc - Encourage OOB, IS - Continue antitussives, add tessalon - Continue airborne, contact precautions for 21 days from positive testing. - Enoxaparin prophylactic dose.    COVID-19 Labs  Recent Labs    12/28/19 0220 12/29/19 0231 12/30/19 0310  DDIMER 0.66* 0.83* 0.74*  FERRITIN 1,175* 1,008* 850*  CRP 1.5* 2.7* 5.8*    No results found for: SARSCOV2NAA  T2DM: Poorly controlled with hyperglycemia exacerbated by steroid-induced hyperglycemia. HbA1c 11%.  - Started basal insulin: Continue lantus 22u BID with fasting CBG at goal. Added mealtime novolog 8u TIDWC > increase to 14u TIDWC, continue resistant SSI, continue HS coverage. - Started linagliptin - continue metformin in preparation for discharge -  with A1c > 11, will need insulin on discharge, discussed with RN for teaching, consult to DM coordinator.  LFT elevation: Improving since admission, more consistent with covid effect. No known hepatitis hx. - Continue monitoring daily while on remdesivir. Slightly up today compared to yesterday.   Obesity: Estimated body mass index is 33.13 kg/m as calculated from the following:   Height as of this encounter: 5' 3"  (1.6 m).   Weight as of this encounter: 84.8 kg.   Hyponatremia: mild, continue to monitor  Thrombocytopenia: POA, resolved. Likely also covid effect.  DVT prophylaxis: lovenox Code Status: full  Family Communication: none at bedside Disposition Plan:  . Patient came from: home           . Anticipated d/c place: home . Barriers to d/c OR conditions which need to be met to effect Ian Mcneil safe d/c: pending further improvement in oxygenation   Consultants:   none  Procedures:  none  Antimicrobials:  Anti-infectives (From admission, onward)   Start     Dose/Rate Route Frequency Ordered Stop   12/26/19 1000  remdesivir 100 mg in sodium chloride 0.9 % 100 mL IVPB     100 mg 200 mL/hr over 30 Minutes Intravenous Daily 12/25/19 2002 12/29/19 0940     Subjective: Feeling better Cough is improved SOB improving Spanish interpreter used  Objective: Vitals:   12/29/19 2000 12/29/19 2045 12/30/19 0321 12/30/19 0755  BP:   106/68 105/79  Pulse: 65  (!) 59 (!) 59  Resp:    17  Temp:  98.8 F (37.1 C) 98.3 F (36.8 C) 98.3 F (36.8 C)  TempSrc:  Oral Oral Oral  SpO2:  90%  Weight:      Height:        Intake/Output Summary (Last 24 hours) at 12/30/2019 0944 Last data filed at 12/29/2019 1800 Gross per 24 hour  Intake 490 ml  Output --  Net 490 ml   Filed Weights   12/25/19 1944  Weight: 84.8 kg    Examination:  General exam: Appears calm and comfortable  Respiratory system: Clear to auscultation. Respiratory effort normal. Satting 88% on 2  L Cardiovascular system: S1 & S2 heard, RRR.  Gastrointestinal system: Abdomen is nondistended, soft and nontender.  Central nervous system: Alert and oriented. No focal neurological deficits. Extremities: no LEE Skin: No rashes, lesions or ulcers Psychiatry: Judgement and insight appear normal. Mood & affect appropriate.     Data Reviewed: I have personally reviewed following labs and imaging studies  CBC: Recent Labs  Lab 12/25/19 1212 12/26/19 0026 12/27/19 0249 12/28/19 0220 12/30/19 0310  WBC 4.6 3.2* 6.7 6.4 7.4  NEUTROABS 3.7 2.0 4.7 4.4 5.5  HGB 16.7 15.6 14.9 14.9 15.6  HCT 46.2 44.2 43.0 42.1 44.9  MCV 91.5 93.1 94.1 94.0 94.5  PLT 147* 157 187 201 811   Basic Metabolic Panel: Recent Labs  Lab 12/26/19 0026 12/27/19 0249 12/28/19 0220 12/29/19 0231 12/30/19 0310  NA 134* 137 135 137 133*  K 4.1 3.9 3.6 3.5 3.7  CL 102 105 103 105 102  CO2 21* 24 24 23 22   GLUCOSE 269* 233* 171* 101* 141*  BUN 14 17 16 11 11   CREATININE 0.62 0.69 0.65 0.53* 0.56*  CALCIUM 8.2* 8.4* 8.2* 8.3* 8.6*   GFR: Estimated Creatinine Clearance: 101.7 mL/min (Ian Mcneil) (by C-G formula based on SCr of 0.56 mg/dL (L)). Liver Function Tests: Recent Labs  Lab 12/26/19 0026 12/27/19 0249 12/28/19 0220 12/29/19 0231 12/30/19 0310  AST 96* 45* 40 47* 34  ALT 87* 61* 52* 58* 48*  ALKPHOS 67 65 59 62 72  BILITOT 0.7 0.6 0.4 0.6 0.6  PROT 6.9 6.1* 6.0* 6.2* 6.5  ALBUMIN 3.2* 3.0* 3.0* 2.9* 2.9*   No results for input(s): LIPASE, AMYLASE in the last 168 hours. No results for input(s): AMMONIA in the last 168 hours. Coagulation Profile: No results for input(s): INR, PROTIME in the last 168 hours. Cardiac Enzymes: No results for input(s): CKTOTAL, CKMB, CKMBINDEX, TROPONINI in the last 168 hours. BNP (last 3 results) No results for input(s): PROBNP in the last 8760 hours. HbA1C: No results for input(s): HGBA1C in the last 72 hours. CBG: Recent Labs  Lab 12/29/19 0730 12/29/19 1123  12/29/19 1631 12/29/19 2048 12/30/19 0742  GLUCAP 71 249* 239* 241* 108*   Lipid Profile: No results for input(s): CHOL, HDL, LDLCALC, TRIG, CHOLHDL, LDLDIRECT in the last 72 hours. Thyroid Function Tests: No results for input(s): TSH, T4TOTAL, FREET4, T3FREE, THYROIDAB in the last 72 hours. Anemia Panel: Recent Labs    12/29/19 0231 12/30/19 0310  FERRITIN 1,008* 850*   Sepsis Labs: Recent Labs  Lab 12/25/19 1212 12/25/19 1326 12/25/19 1327 12/30/19 0310  PROCALCITON  --  0.35  --  <0.10  LATICACIDVEN 1.9  --  1.6  --     Recent Results (from the past 240 hour(s))  Blood Culture (routine x 2)     Status: None   Collection Time: 12/25/19 12:12 PM   Specimen: BLOOD  Result Value Ref Range Status   Specimen Description BLOOD R F Ian Mcneil  Final   Special Requests   Final  BOTTLES DRAWN AEROBIC AND ANAEROBIC Blood Culture adequate volume   Culture   Final    NO GROWTH 5 DAYS Performed at [facility], [address] [place], [place] [address]    Report Status 12/30/2019 FINAL  Final  Blood Culture (routine x 2)     Status: None   Collection Time: 12/25/19  1:27 PM   Specimen: BLOOD  Result Value Ref Range Status   Specimen Description BLOOD BLOOD RIGHT HAND  Final   Special Requests   Final    BOTTLES DRAWN AEROBIC AND ANAEROBIC Blood Culture results may not be optimal due to an inadequate volume of blood received in culture bottles   Culture   Final    NO GROWTH 5 DAYS Performed at [facility], [address] [place], [place] [address]    Report Status 12/30/2019 FINAL  Final         Radiology Studies: No results found.      Scheduled Meds: . vitamin C  500 mg Oral Daily  . benzonatate  100 mg Oral TID  . dexamethasone  6 mg Oral Q24H  . docusate sodium  100 mg Oral Daily  . enoxaparin (LOVENOX) injection  40 mg Subcutaneous Q24H  . insulin aspart  0-20 Units Subcutaneous TID WC  . insulin aspart  0-5 Units Subcutaneous QHS  .  insulin aspart  14 Units Subcutaneous TID WC  . insulin glargine  20 Units Subcutaneous BID  . Ipratropium-Albuterol  1 puff Inhalation Q6H  . linagliptin  5 mg Oral Daily  . metFORMIN  500 mg Oral BID WC  . zinc sulfate  220 mg Oral Daily   Continuous Infusions:   LOS: 5 days    Time spent: over 30 min    Fayrene Helper, MD [facility]   To contact the attending provider between 7A-7P or the covering provider during after hours 7P-7A, please log into the web site [email or web address] and access using universal [place] password for that web site. If you do not have the password, please call the hospital operator.  12/30/2019, 9:44 AM

## 2019-12-30 NOTE — Progress Notes (Addendum)
Inpatient Diabetes Program Recommendations  AACE/ADA: New Consensus Statement on Inpatient Glycemic Control  Target Ranges:  Prepandial:   less than 140 mg/dL      Peak postprandial:   less than 180 mg/dL (1-2 hours)      Critically ill patients:  140 - 180 mg/dL  Results for Ian Mcneil, Ian Mcneil (MRN [id number]) as of 12/30/2019 08:11  Ref. Range 12/30/2019 03:10  Glucose Latest Ref Range: 70 - 99 mg/dL 141 (H)   Results for [name], [name] (MRN [id number]) as of 12/30/2019 08:11  Ref. Range 12/29/2019 07:30 12/29/2019 11:23 12/29/2019 16:31 12/29/2019 20:48  Glucose-Capillary Latest Ref Range: 70 - 99 mg/dL 71 249 (H) 239 (H) 241 (H)    Review of Glycemic Control  Diabetes history:DM2 Outpatient Diabetes medications:None Current orders for Inpatient glycemic control:Lantus20units BID, Novolog 0-20 units TID with meals, Novolog 0-5 units QHS, Novolog 14 units TID with meals, Tradjenta 5 mg daily, Metformin 500 mg BID; Decadron 6 mg Q24H   Inpatient Diabetes Program Recommendations:   Insulin-Meal Coverage: If steroids are continued, please consider increasing meal coverage to Novolog 18 units TID with meals.  NOTE: Inpatient Diabetes Coordinator spoke with patient on 12/28/19 regarding DM and A1C (see note for details). Also discussed potential of discharging on insulin and patient noted he was agreeable to take insulin at home. Ordered insulin starter kit and patient education by RNs on insulin teaching. Sent chat message to B. Coscia, RN to ask that RNs work with patient on insulin administration and allow patient to self administer insulin while inpatient. RN noted that patient has already began giving himself insulin injections and has given 2 injections to himself today. Will continue to follow along.  Thanks, Barnie Alderman, RN, MSN, CDE Diabetes Coordinator Inpatient Diabetes Program [phone] (Team Pager from 8am to 5pm)

## 2019-12-30 NOTE — Progress Notes (Addendum)
Patient A/Ox4, sitting in chair, SpO2 96% on 2L [place]. Stable vital signs.  Patient has no complaints of dizziness or shortness of breath just cough at this time.  Morning medication given with no issues. . Robitussin given for cough.Patient to be discharged on home insulin. Patient educated on insulin administration. Patient to administer insulin today to get comfortable before discharge. Interpreter #[id number] used to help educated patient.  Call light in reach. Will continue to monitor.  1110: Patient ambulated in the hall with physical therapy.  1215: Tussionex given for cough. Diabetes starter kit given to patient. Patient informed he will nit be discharged today. Patient of phone with wife to update.   1655: Patient gave himself dinner insulin with no issues or questions. Patient sitting in chair. Call light in reach.    

## 2019-12-31 LAB — CBC WITH DIFFERENTIAL/PLATELET
Abs Immature Granulocytes: 0.28 10*3/uL — ABNORMAL HIGH (ref 0.00–0.07)
Basophils Absolute: 0 10*3/uL (ref 0.0–0.1)
Basophils Relative: 1 %
Eosinophils Absolute: 0 10*3/uL (ref 0.0–0.5)
Eosinophils Relative: 0 %
HCT: 45.3 % (ref 39.0–52.0)
Hemoglobin: 15.7 g/dL (ref 13.0–17.0)
Immature Granulocytes: 3 %
Lymphocytes Relative: 11 %
Lymphs Abs: 1 10*3/uL (ref 0.7–4.0)
MCH: 33 pg (ref 26.0–34.0)
MCHC: 34.7 g/dL (ref 30.0–36.0)
MCV: 95.2 fL (ref 80.0–100.0)
Monocytes Absolute: 0.7 10*3/uL (ref 0.1–1.0)
Monocytes Relative: 7 %
Neutro Abs: 6.8 10*3/uL (ref 1.7–7.7)
Neutrophils Relative %: 78 %
Platelets: 333 10*3/uL (ref 150–400)
RBC: 4.76 MIL/uL (ref 4.22–5.81)
RDW: 12.6 % (ref 11.5–15.5)
WBC: 8.8 10*3/uL (ref 4.0–10.5)
nRBC: 0 % (ref 0.0–0.2)

## 2019-12-31 LAB — PHOSPHORUS: Phosphorus: 3.9 mg/dL (ref 2.5–4.6)

## 2019-12-31 LAB — COMPREHENSIVE METABOLIC PANEL
ALT: 46 U/L — ABNORMAL HIGH (ref 0–44)
AST: 38 U/L (ref 15–41)
Albumin: 2.9 g/dL — ABNORMAL LOW (ref 3.5–5.0)
Alkaline Phosphatase: 75 U/L (ref 38–126)
Anion gap: 10 (ref 5–15)
BUN: 14 mg/dL (ref 6–20)
CO2: 23 mmol/L (ref 22–32)
Calcium: 8.7 mg/dL — ABNORMAL LOW (ref 8.9–10.3)
Chloride: 101 mmol/L (ref 98–111)
Creatinine, Ser: 0.6 mg/dL — ABNORMAL LOW (ref 0.61–1.24)
GFR calc Af Amer: >60 mL/min (ref >60)
GFR calc non Af Amer: >60 mL/min (ref >60)
Glucose, Bld: 201 mg/dL — ABNORMAL HIGH (ref 70–99)
Potassium: 4.1 mmol/L (ref 3.5–5.1)
Sodium: 134 mmol/L — ABNORMAL LOW (ref 135–145)
Total Bilirubin: 0.6 mg/dL (ref 0.3–1.2)
Total Protein: 6.6 g/dL (ref 6.5–8.1)

## 2019-12-31 LAB — D-DIMER, QUANTITATIVE: D-Dimer, Quant: 0.58 ug/mL-FEU — ABNORMAL HIGH (ref 0.00–0.50)

## 2019-12-31 LAB — C-REACTIVE PROTEIN: CRP: 3.8 mg/dL — ABNORMAL HIGH (ref <1.0)

## 2019-12-31 LAB — GLUCOSE, CAPILLARY
Glucose-Capillary: 109 mg/dL — ABNORMAL HIGH (ref 70–99)
Glucose-Capillary: 194 mg/dL — ABNORMAL HIGH (ref 70–99)
Glucose-Capillary: 247 mg/dL — ABNORMAL HIGH (ref 70–99)
Glucose-Capillary: 261 mg/dL — ABNORMAL HIGH (ref 70–99)

## 2019-12-31 LAB — MAGNESIUM: Magnesium: 2 mg/dL (ref 1.7–2.4)

## 2019-12-31 LAB — FERRITIN: Ferritin: 911 ng/mL — ABNORMAL HIGH (ref 24–336)

## 2019-12-31 LAB — PROCALCITONIN: Procalcitonin: <0.1 ng/mL

## 2019-12-31 MED ORDER — NOVOLIN 70/30 RELION (70-30) 100 UNIT/ML ~~LOC~~ SUSP: 25.0000 [IU] | Freq: Two times a day (BID) | SUBCUTANEOUS | 11 refills | Status: DC

## 2019-12-31 MED ORDER — NOVOLIN 70/30 RELION (70-30) 100 UNIT/ML ~~LOC~~ SUSP: 25.0000 [IU] | Freq: Two times a day (BID) | SUBCUTANEOUS | 1 refills | Status: AC

## 2019-12-31 MED ORDER — METFORMIN HCL 500 MG PO TABS: 500.0000 mg | ORAL_TABLET | Freq: Two times a day (BID) | ORAL | 0 refills | Status: AC

## 2019-12-31 MED ORDER — BLOOD GLUCOSE MONITOR KIT: PACK | 0 refills | Status: AC

## 2019-12-31 MED ORDER — METFORMIN HCL 500 MG PO TABS: 500.0000 mg | ORAL_TABLET | Freq: Two times a day (BID) | ORAL | 0 refills | Status: DC

## 2019-12-31 NOTE — Progress Notes (Signed)
Pt and this RN confirmed that O2 was delivered to house. Pt family had O2 in the car at discharge for this pt. Pt is now discharged and gone home.

## 2019-12-31 NOTE — Discharge Summary (Signed)
Physician Discharge Summary  Ian Mcneil TIW:[id number] DOB: 1966-03-19 DOA: 12/25/2019  PCP: Patient, No Pcp Per  Admit date: 12/25/2019 Discharge date: 12/31/2019  Time spent: 40 minutes  Recommendations for Outpatient Follow-up:  1. Follow outpatient CBC/CMP 2. Follow insulin regimen and BG's outpatient 3. Follow oxygen needs outpatient 4. Quarantine per CDC protocol - until March 12 5. Follow outpatient CXR  Discharge Diagnoses:  Principal Problem:   Acute hypoxemic respiratory failure due to severe acute respiratory syndrome coronavirus 2 (SARS-CoV-2) disease (HCC) Active Problems:   Uncontrolled diabetes mellitus with hyperglycemia (HCC)   Hyponatremia   Obesity (BMI 30.0-34.9)  Discharge Condition: stable  Diet recommendation: diabetic  Filed Weights   12/25/19 1944  Weight: 84.8 kg   History of present illness:  Ian Mcneil Ian Mcneil 54 y.o.malewith Ian Mcneil history of T2DM, HTN, and obesity not taking medications who presented to [facility] ED 2/19 with 3 days of worsening persistent cough and shortness of breath found to be hypoxic, febrile, hypertensive, and hyperglycemic. CRP elevated to 5.8, troponin normal x2, BNP normal, PCT 0.35, lactic acid normal at 1.9. SARS-CoV-2 antigen was positive. Remdesivir and steroids were started and the patient was admitted to [facility]. Hypoxemia has continued and inflammatory markers have shown improvement.  He was admitted for COVID pneumonia.  He's improved with steroids and remdesivir.  He was discharged with supplemental oxygen with activity on 2/25.  He was also started on insulin and metformin for Ian Mcneil new diagnosis of diabetes.  See below for additional details  Hospital Course:  Acute hypoxemic respiratory failure due to covid-19 pneumonia: SARS-CoV-2 Ag positive on 2/19.  - Completeremdesivir x5 days (2/19 - 2/23).  S/p steroids until discharge. - Steroids x10 days. CRPmildly elevated on day of discharge. No leukocytosis previously or new  fever.  Negative procalcitonin. - Vitamin C, zinc - Encourage OOB, IS - Continue antitussives, add tessalon - Continue airborne, contact precautions for 21 days from positive testing (march 12) - requiring 1 L with exertion, will discharge with supplemental oxygen  COVID-19 Labs  Recent Labs    12/29/19 0231 12/30/19 0310 12/31/19 0145  DDIMER 0.83* 0.74* 0.58*  FERRITIN 1,008* 850* 911*  CRP 2.7* 5.8* 3.8*    No results found for: SARSCOV2NAA  T2DM: Poorly controlled with hyperglycemia exacerbated by steroid-induced hyperglycemia. HbA1c 11%.  - discharge with 70/30 25 units BID and metformin (was on 20 units lantus BID and 16 units aspart TID with meals) - will need close follow up as his steroids are being discontinued  - needs outpatient follow up  LFT elevation: Improving since admission, more consistent with covid effect. No known hepatitis hx. - follow outpatient  Obesity:Estimated body mass index is 33.13 kg/m as calculated from the following: Height as of this encounter: 5' 3"  (1.6 m). Weight as of this encounter: 84.8 kg.  Hyponatremia: mild, continue to monitor  Thrombocytopenia: POA, resolved. Likely also covid effect.  Procedures:  none  Consultations:  none  Discharge Exam: Vitals:   12/31/19 1538 12/31/19 1618  BP:  109/78  Pulse: 66 66  Resp:  18  Temp:  97.8 F (36.6 C)  SpO2: 91% 90%   Ian Mcneil No new concerns Discussed discharge POC  General: No acute distress. Cardiovascular: Heart sounds show Ian Mcneil regular rate, and rhythm.  Lungs: Clear to auscultation bilaterally  Abdomen: Soft, nontender, nondistended  Neurological: Alert and oriented 3. Moves all extremities 4. Cranial nerves II through XII grossly intact. Skin: Warm and dry. No rashes or lesions.  Extremities: No clubbing or cyanosis. No edema.  Discharge Instructions   Discharge Instructions    Call MD for:  difficulty breathing, headache or  visual disturbances   Complete by: As directed    Call MD for:  extreme fatigue   Complete by: As directed    Call MD for:  persistant dizziness or light-headedness   Complete by: As directed    Call MD for:  persistant nausea and vomiting   Complete by: As directed    Call MD for:  redness, tenderness, or signs of infection (pain, swelling, redness, odor or green/yellow discharge around incision site)   Complete by: As directed    Call MD for:  severe uncontrolled pain   Complete by: As directed    Call MD for:  temperature >100.4   Complete by: As directed    Diet - low sodium heart healthy   Complete by: As directed    Diet - low sodium heart healthy   Complete by: As directed    Discharge instructions   Complete by: As directed    You were seen for COVID 19.  You've improved with steroids and remdesivir.  You should continue to quarantine until March 12th.  You can discontinue your quarantine after this date if you continue to improve without using fever reducing meds.  We'll send you home with supplemental oxygen to use with activity.  Use 1 L with activity and at night.    You have diabetes.  We're sending you home with metformin.  You will need to use insulin when you go home.  We'll send you home with 25 units of 70/30 insulin to take twice daily (once with breakfast and once with dinner).  Try to keep your oral intake consistent to avoid low blood sugars.  Check your blood sugar before meals and keep Ian Mcneil list to bring to your PCP.  Follow up with Ian Mcneil PCP to further adjust your blood sugars.   Return for new, recurrent, or worsening symptoms.  Please ask your PCP to request records from this hospitalization so they know what was done and what the next steps will be.   Increase activity slowly   Complete by: As directed    Increase activity slowly   Complete by: As directed    MyChart COVID-19 home monitoring program   Complete by: Dec 31, 2019    Is the patient willing to use  the [place] for home monitoring?: Yes   Temperature monitoring   Complete by: Dec 31, 2019    After how many days would you like to receive Zyliah Schier notification of this patient's flowsheet entries?: 1     Allergies as of 12/31/2019   No Known Allergies     Medication List    TAKE these medications   blood glucose meter kit and supplies Kit Dispense based on patient and insurance preference. Use up to four times daily as directed. (FOR ICD-9 250.00, 250.01).   metFORMIN 500 MG tablet Commonly known as: GLUCOPHAGE Take 1 tablet (500 mg total) by mouth 2 (two) times daily with Faaris Arizpe meal.   NovoLIN 70/30 ReliOn (70-30) 100 UNIT/ML injection Generic drug: insulin NPH-regular Human Inject 25 Units into the skin 2 (two) times daily with Samanthajo Payano meal.            Durable Medical Equipment  (From admission, onward)         Start     Ordered   12/31/19 1434  DME Oxygen  Once    Comments: At rest, oxygen saturations >88% With activity, patients saturations drop to 87% After adding 1 L supplemental oxygen by [place], oxygen saturations improve to >94% Patient needs home oxygen due to desaturation on room air with activity which improves with supplemental activity.  Question Answer Comment  Length of Need 6 Months   Mode or (Route) Nasal cannula   Liters per Minute 1   Frequency Continuous (stationary and portable oxygen unit needed)   Oxygen conserving device Yes   Oxygen delivery system Gas      12/31/19 1435         No Known Allergies Follow-up [place] Follow up.   Why: porfavor llamar para hacer una cita.  Contact information: [address] [place] [id number] [phone]            The results of significant diagnostics from this hospitalization (including imaging, microbiology, ancillary and laboratory) are listed below for reference.    Significant Diagnostic Studies: DG Chest 2 View  Result Date:  12/25/2019 CLINICAL DATA:  Chest pain and cough. EXAM: CHEST - 2 VIEW COMPARISON:  None. FINDINGS: Lung volumes are low crowding of bronchovascular structures. Patchy airspace disease is present in the lung bases. Heart size is normal. No pneumothorax or pleural fluid. No acute or focal bony abnormality. IMPRESSION: Patchy bibasilar airspace disease could be due to atelectasis or pneumonia. Electronically Signed   By: Inge Rise M.D.   On: 12/25/2019 12:53    Microbiology: Recent Results (from the past 240 hour(s))  Blood Culture (routine x 2)     Status: None   Collection Time: 12/25/19 12:12 PM   Specimen: BLOOD  Result Value Ref Range Status   Specimen Description BLOOD R F Vartan Kerins  Final   Special Requests   Final    BOTTLES DRAWN AEROBIC AND ANAEROBIC Blood Culture adequate volume   Culture   Final    NO GROWTH 5 DAYS Performed at [facility], [address] [place], [place] [address]    Report Status 12/30/2019 FINAL  Final  Blood Culture (routine x 2)     Status: None   Collection Time: 12/25/19  1:27 PM   Specimen: BLOOD  Result Value Ref Range Status   Specimen Description BLOOD BLOOD RIGHT HAND  Final   Special Requests   Final    BOTTLES DRAWN AEROBIC AND ANAEROBIC Blood Culture results may not be optimal due to an inadequate volume of blood received in culture bottles   Culture   Final    NO GROWTH 5 DAYS Performed at [facility], [address], [place], [place] [address]    Report Status 12/30/2019 FINAL  Final     Labs: Basic Metabolic Panel: Recent Labs  Lab 12/27/19 0249 12/28/19 0220 12/29/19 0231 12/30/19 0310 12/31/19 0145  NA 137 135 137 133* 134*  K 3.9 3.6 3.5 3.7 4.1  CL 105 103 105 102 101  CO2 24 24 23 22 23   GLUCOSE 233* 171* 101* 141* 201*  BUN 17 16 11 11 14   CREATININE 0.69 0.65 0.53* 0.56* 0.60*  CALCIUM 8.4* 8.2* 8.3* 8.6* 8.7*  MG  --   --   --   --  2.0  PHOS  --   --   --   --  3.9   Liver Function  Tests: Recent Labs  Lab 12/27/19 0249 12/28/19 0220 12/29/19 0231 12/30/19 0310 12/31/19 0145  AST 45*  40 47* 34 38  ALT 61* 52* 58* 48* 46*  ALKPHOS 65 59 62 72 75  BILITOT 0.6 0.4 0.6 0.6 0.6  PROT 6.1* 6.0* 6.2* 6.5 6.6  ALBUMIN 3.0* 3.0* 2.9* 2.9* 2.9*   No results for input(s): LIPASE, AMYLASE in the last 168 hours. No results for input(s): AMMONIA in the last 168 hours. CBC: Recent Labs  Lab 12/26/19 0026 12/27/19 0249 12/28/19 0220 12/30/19 0310 12/31/19 0145  WBC 3.2* 6.7 6.4 7.4 8.8  NEUTROABS 2.0 4.7 4.4 5.5 6.8  HGB 15.6 14.9 14.9 15.6 15.7  HCT 44.2 43.0 42.1 44.9 45.3  MCV 93.1 94.1 94.0 94.5 95.2  PLT 157 187 201 276 333   Cardiac Enzymes: No results for input(s): CKTOTAL, CKMB, CKMBINDEX, TROPONINI in the last 168 hours. BNP: BNP (last 3 results) Recent Labs    12/25/19 1327  BNP 19.0    ProBNP (last 3 results) No results for input(s): PROBNP in the last 8760 hours.  CBG: Recent Labs  Lab 12/30/19 1638 12/30/19 2038 12/31/19 0750 12/31/19 1115 12/31/19 1616  GLUCAP 208* 246* 109* 194* 247*       Signed:  Fayrene Helper MD.  [facility] 12/31/2019, 6:08 PM

## 2019-12-31 NOTE — Plan of Care (Signed)

## 2019-12-31 NOTE — Progress Notes (Signed)
Patient states wife will pick him up in family car, belongings returned to patient, IV removed and tolerated well, discharge instructions given, explained by translator and understanding expressed. Awaiting oxygen delivery

## 2019-12-31 NOTE — TOC Transition Note (Signed)
Transition of Care [facility]) - CM/SW Discharge Note   Patient Details  Name: Tywon Niday MRN: [id number] Date of Birth: December 31, 1965  [facility] of [facility] CM/SW Contact:  Golda Acre, RN Phone Number: 12/31/2019, 2:37 PM   Clinical Narrative:    Home o2 through [facility] to be setup today.     Barriers to Discharge: Continued Medical Work up   Patient Goals and CMS Choice Patient states their goals for this hospitalization and ongoing recovery are:: to go home   Choice offered to / list presented to : Patient  Discharge Placement                       Discharge Plan and Services   Discharge Planning Services: CM Consult Post Acute Care Choice: Durable Medical Equipment          DME Arranged: Oxygen DME Agency: Patsy Lager Date DME Agency Contacted: 12/31/19 Time DME Agency Contacted: [phone] Representative spoke with at DME Agency: Morrie Sheldon            Social Determinants of Health (SDOH) Interventions     Readmission Risk Interventions No flowsheet data found.

## 2019-12-31 NOTE — Discharge Instructions (Signed)
Contar carbohidratos y la diabetes  Por qu es importante el conteo de carbohidratos?  Ian Mcneil las porciones de carbohidratos ayuda Ian Mcneil Freight forwarder nivel de glucosa (azcar) en su sangre para que se sienta mejor.  . El equilibrio entre los carbohidratos que come y [name] determina el nivel de glucosa que tendr en la sangre despus de comer.  Ian Mcneil carbohidratos tambin le ayudar Ian Mcneil planificar sus comidas.   Qu alimentos contienen carbohidratos?  Entre los alimentos con carbohidratos se incluyen:  . Panes, galletas saladas y cereales  . Pastas, arroz y granos  . Vegetales (verduras) con almidn, como papas, elote (maz o choclo) y chcharos (guisantes o arvejas)  . Frijoles (habichuelas) y legumbres  . Leche, leche de soya y Estate agent  . Ian Mcneil y jugos de fruta  . Dulces como pasteles, galletas, helados, mermeladas y jaleas   Porciones de carbohidratos  Al planificar comidas para la diabetes, recuerde que un alimento con 1 porcin de carbohidratos contiene aproximadamente 15 gramos de carbohidratos:  . Revise el tamao de las porciones con tazas y cucharas de medir o con una pesa de alimentos.  Ian Mcneil los Datos de Nutricin en las etiquetas de los alimentos para saber cuntos gramos de carbohidratos contienen los alimentos que come.   Los [name] de este folleto muestran porciones que contienen cerca de 15 gramos de carbohidratos. Copyright Academy of Nutrition and Dietetics. This handout may be duplicated for client education. Carbohydrate Counting for Diabetes (Spanish) - 2   Consejos para planificar sus comidas  . Un Plan de Alimentacin indica cuntas porciones de carbohidratos consumir en sus comidas y refrigerios (snacks). Para muchos adultos es adecuado comer 3 Ian Mcneil 5 porciones de carbohidratos en cada comida y de 1 Ian Mcneil 2 porciones de carbohidratos, en cada refrigerio.  . En un Plan de Alimentacin diaria saludable, la mayora de los carbohidratos provienen de:  o  Al menos 6 porciones de frutas y vegetales sin almidn  o Al menos 6 porciones de Engineer, manufacturing, frijoles y Photographer con almidn, con al menos 3 de estas porciones de granos integrales (enteros)  o Al menos 2 porciones de Air traffic controller o productos lcteos  . Revise regularmente su nivel de glucosa en la sangre. Esto puede indicarle si necesita ajustar las horas Ian Mcneil las que consume carbohidratos.  . Comer alimentos que contienen [name], como granos [place], y comer muy pocos alimentos salados es bueno para su salud.  . Coma 4 Ian Mcneil 6 onzas de carne u otros alimentos con protenas (como hamburguesas de soya) cada da. Elija fuentes de protena bajas en grasa, como carne de res y de cerdo bajas en grasa, pollo, pescado, queso bajo en grasa o alimentos vegetarianos como la soya.  . Coma algunas grasas saludables, como aceite de [name], de canola y nueces.  . Coma muy pocas grasas saturadas. Estas grasas no son saludables y se Occupational psychologist, la crema y las carnes con mucha grasa, como el tocino (tocineta) y las salchichas o Photographer.  . Coma muy pocas o nada de grasas trans. Estas grasas no son saludables y se encuentran en todos los alimentos que contienen aceites "parcialmente hidrogenados" en su lista de ingredientes.   Consejos para leer etiquetas  En los Datos de Nutricin de las etiquetas aparece una lista con el total de gramos de carbohidratos en una porcin estndar. La porcin estndar puede ser mayor o menor que 1 porcin de carbohidratos. Para saber cuntas porciones de carbohidratos hay  en un alimento:  . Primero mire el tamao de la porcin estndar de la etiqueta.  Ian Mcneil verifique el total de gramos de carbohidratos. Esta es la cantidad de carbohidratos en 1 porcin estndar. Divida el total de gramos de carbohidratos por 15. Este nmero equivale al nmero de porciones de carbohidratos en 1 porcin estndar. Recuerde: 1 porcin de carbohidratos equivale Ian Mcneil 15 gramos de carbohidratos.  . Nota:  Puede ignorar los gramos de Ian Mcneil Datos de Nutricin, ya que estn incluidos en el total de gramos de carbohidratos.  Copyright Academy of Nutrition and Dietetics. This handout may be duplicated for client education. Carbohydrate Counting for Diabetes (Spanish) - 3   Listas de alimentos para el conteo de carbohidratos  1 porcin = cerca de 15 gramos de carbohidratos  Almidones  . 1 rebanada de pan (1 onza)  . 1 tortilla (6 pulgadas)  .  rosca de pan (bagel) grande (1 onza)  . 2 tortillas para taco (5 pulgadas)  .  pan para hamburguesa o para salchicha (hot dog) (3/4 onza)  .  taza de cereal listo para comer sin endulzar  .  taza de cereal cocido  . 1 taza de sopa Ian Mcneil base de caldo  . 4-6 galletitas saladas  . ? taza de pasta o arroz (cocidos)  .  taza de frijoles, chcharos, granos de elote, camotes (batatas, boniatos), calabaza (zapallo), pur de papas o papas hervidas (cocidos)  .  papa grande asada (3 onzas)  .  onza de pretzels, papitas o totopos (tortilla chips)  . 3 tazas de palomitas de maz Ian exhibitions officer) (ya preparadas)   Ian Mcneil  . 1 fruta fresca pequea ( Ian Mcneil 1 taza)  .  taza de fruta enlatada o congelada  . 17 uvas pequeas (3 onzas)  . 1 taza de meln, bayas (moras)  .  vaso de jugo de fruta  . 2 cucharadas de frutas secas (arndanos azules/blueberries, cerezas, arndanos rojos/cranberries, frutas surtidas, uvas pasas/pasitas)  Leche  . 1 taza de Ian Mcneil o reducida en grasa  . 1 taza de leche de soya  . ? taza de yogur descremado endulzado con un edulcorante sin azcar (6 onzas)   Dulces y postres  . pastel cuadrado de 2 pulgadas (sin betn/cobertura)  . 2 galletitas dulces (? onzas)  .  taza de helado o yogur congelado  .  taza de sorbete (sherbet) o nieve (sorbet)  . 1 cucharada de jarabe (sirope), mermelada, jalea, azcar o miel  . 2 cucharadas de jarabe bajo en caloras  Copyright Academy of Nutrition and Dietetics. This handout may be  duplicated for client education. Carbohydrate Counting for Diabetes (Spanish) - 4   Otros alimentos  . Cuente 1 taza de vegetales crudos o  taza de vegetales sin almidn, cocidas, como porciones de alimentos con cero (0) carbohidratos o "sin restriccin." Si come 3 o ms porciones en una comida, cuntelas como 1 porcin de carbohidratos.  . Los alimentos que contienen menos de 20 caloras en cada porcin tambin pueden contarse como porciones con cero carbohidratos o alimentos "sin restriccin."  . Cuente 1 taza de guiso (estofado) u otros alimentos combinados como 2 porciones de carbohidratos.   Notas: Copyright Academy of Nutrition and Dietetics. This handout may be duplicated for client education. Carbohydrate Counting for Diabetes (Spanish) - 5   Contar carbohidratos y la diabetes: Ejemplo de men para 1 da Desayuno  1 pltano/banana pequeo (1 carbohidrato)   taza de hojuelas de maz (cornflakes) (1  carbohidrato)  1 taza de leche descremada o baja en grasa (1 carbohidrato)  1 rebanada de pan de trigo integral (1 carbohidrato)  1 cucharadita de margarina   Almuerzo  2 onzas de rebanadas de [place]  2 rebanadas de pan de trigo integral (2 carbohidratos)  2 hojas de Company secretary  4 palitos de apio  4 palitos de zanahoria  1 Environmental health practitioner (1 carbohidrato)  1 taza de leche descremada o baja en grasa (1 carbohidrato)   Refrigerio  2 cucharadas de uvas pasas/pasitas (1 carbohidrato)   onzas de mini pretzels sin sal (1 carbohidrato)   Cena  3 onzas de carne asada de res, magra   papa grande asada (2 carbohidratos)  1 cucharada de crema agria reducida en grasa   taza de ejotes/habichuelas verdes/chauchas  1 taza de ensalada de vegetales  1 cucharada de aderezo para ensaladas reducido en caloras  1 panecillo de trigo integral (1 carbohidrato)  1 cucharadita de margarina  1 taza de bolitas de meln (1 carbohidrato)   Refrigerio  6 onzas de yogur de frutas bajo en grasa, sin azcar (1  carbohidrato)  2 cucharadas de nueces sin sal    You should continue to quarantine until March 12th.  You can discontinue your quarantine after this date if you continue to improve without using fever reducing meds.     Person Under Monitoring Name: Ian Mcneil  Location: [address] [place] [address]   Infection Prevention Recommendations for Individuals Confirmed to have, or Being Evaluated for, 2019 Novel Coronavirus (COVID-19) Infection Who Receive Care at Home  Individuals who are confirmed to have, or are being evaluated for, COVID-19 should follow the prevention steps below until Kambri Dismore healthcare provider or local or state health department says they can return to normal activities.  Stay home except to get medical care You should restrict activities outside your home, except for getting medical care. Do not go to work, school, or public areas, and do not use public transportation or taxis.  Call ahead before visiting your doctor Before your medical appointment, call the healthcare provider and tell them that you have, or are being evaluated for, COVID-19 infection. This will help the healthcare provider's office take steps to keep other people from getting infected. Ask your healthcare provider to call the local or state health department.  Monitor your symptoms Seek prompt medical attention if your illness is worsening (e.g., difficulty breathing). Before going to your medical appointment, call the healthcare provider and tell them that you have, or are being evaluated for, COVID-19 infection. Ask your healthcare provider to call the local or state health department.  Wear Agnes Probert facemask You should wear Cherika Jessie facemask that covers your nose and mouth when you are in the same room with other people and when you visit Chosen Geske healthcare provider. People who live with or visit you should also wear Tollie Canada facemask while they are in the same room with you.  Separate yourself from  other people in your home As much as possible, you should stay in Brenson Hartman different room from other people in your home. Also, you should use Silvestre Mines separate bathroom, if available.  Avoid sharing household items You should not share dishes, drinking glasses, cups, eating utensils, towels, bedding, or other items with other people in your home. After using these items, you should wash them thoroughly with soap and water.  Cover your coughs and sneezes Cover your mouth and nose with Lynne Takemoto tissue when you cough  or sneeze, or you can cough or sneeze into your sleeve. Throw used tissues in Jaelyn Cloninger lined trash can, and immediately wash your hands with soap and water for at least 20 seconds or use an alcohol-based hand rub.  Wash your [facility] your hands often and thoroughly with soap and water for at least 20 seconds. You can use an alcohol-based hand sanitizer if soap and water are not available and if your hands are not visibly dirty. Avoid touching your eyes, nose, and mouth with unwashed hands.   Prevention Steps for Caregivers and Household Members of Individuals Confirmed to have, or Being Evaluated for, COVID-19 Infection Being Cared for in the Home  If you live with, or provide care at home for, Seri Kimmer person confirmed to have, or being evaluated for, COVID-19 infection please follow these guidelines to prevent infection:  Follow healthcare provider's instructions Make sure that you understand and can help the patient follow any healthcare provider instructions for all care.  Provide for the patient's basic needs You should help the patient with basic needs in the home and provide support for getting groceries, prescriptions, and other personal needs.  Monitor the patient's symptoms If they are getting sicker, call his or her medical provider and tell them that the patient has, or is being evaluated for, COVID-19 infection. This will help the healthcare provider's office take steps to keep other people  from getting infected. Ask the healthcare provider to call the local or state health department.  Limit the number of people who have contact with the patient  If possible, have only one caregiver for the patient.  Other household members should stay in another home or place of residence. If this is not possible, they should stay  in another room, or be separated from the patient as much as possible. Use Zofia Peckinpaugh separate bathroom, if available.  Restrict visitors who do not have an essential need to be in the home.  Keep older adults, very young children, and other sick people away from the patient Keep older adults, very young children, and those who have compromised immune systems or chronic health conditions away from the patient. This includes people with chronic heart, lung, or kidney conditions, diabetes, and cancer.  Ensure good ventilation Make sure that shared spaces in the home have good air flow, such as from an air conditioner or an opened window, weather permitting.  Wash your hands often  Wash your hands often and thoroughly with soap and water for at least 20 seconds. You can use an alcohol based hand sanitizer if soap and water are not available and if your hands are not visibly dirty.  Avoid touching your eyes, nose, and mouth with unwashed hands.  Use disposable paper towels to dry your hands. If not available, use dedicated cloth towels and replace them when they become wet.  Wear Dashiell Franchino facemask and gloves  Wear Melainie Krinsky disposable facemask at all times in the room and gloves when you touch or have contact with the patient's blood, body fluids, and/or secretions or excretions, such as sweat, saliva, sputum, nasal mucus, vomit, urine, or feces.  Ensure the mask fits over your nose and mouth tightly, and do not touch it during use.  Throw out disposable facemasks and gloves after using them. Do not reuse.  Wash your hands immediately after removing your facemask and gloves.  If  your personal clothing becomes contaminated, carefully remove clothing and launder. Wash your hands after handling contaminated clothing.  Place all  used disposable facemasks, gloves, and other waste in Bryston Colocho lined container before disposing them with other household waste.  Remove gloves and wash your hands immediately after handling these items.  Do not share dishes, glasses, or other household items with the patient  Avoid sharing household items. You should not share dishes, drinking glasses, cups, eating utensils, towels, bedding, or other items with Vedder Brittian patient who is confirmed to have, or being evaluated for, COVID-19 infection.  After the person uses these items, you should wash them thoroughly with soap and water.  Wash laundry thoroughly  Immediately remove and wash clothes or bedding that have blood, body fluids, and/or secretions or excretions, such as sweat, saliva, sputum, nasal mucus, vomit, urine, or feces, on them.  Wear gloves when handling laundry from the patient.  Read and follow directions on labels of laundry or clothing items and detergent. In general, wash and dry with the warmest temperatures recommended on the label.  Clean all areas the individual has used often  Clean all touchable surfaces, such as counters, tabletops, doorknobs, bathroom fixtures, toilets, phones, keyboards, tablets, and bedside tables, every day. Also, clean any surfaces that may have blood, body fluids, and/or secretions or excretions on them.  Wear gloves when cleaning surfaces the patient has come in contact with.  Use Deveney Bayon diluted bleach solution (e.g., dilute bleach with 1 part bleach and 10 parts water) or Deziah Renwick household disinfectant with Magin Balbi label that says EPA-registered for coronaviruses. To make Arleen Bar bleach solution at home, add 1 tablespoon of bleach to 1 quart (4 cups) of water. For Seema Blum larger supply, add  cup of bleach to 1 gallon (16 cups) of water.  Read labels of cleaning products and follow  recommendations provided on product labels. Labels contain instructions for safe and effective use of the cleaning product including precautions you should take when applying the product, such as wearing gloves or eye protection and making sure you have good ventilation during use of the product.  Remove gloves and wash hands immediately after cleaning.  Monitor yourself for signs and symptoms of illness Caregivers and household members are considered close contacts, should monitor their health, and will be asked to limit movement outside of the home to the extent possible. Follow the monitoring steps for close contacts listed on the symptom monitoring form.   ? If you have additional questions, contact your local health department or call the epidemiologist on call at [phone] (available 24/7). ? This guidance is subject to change. For the most up-to-date guidance from [facility], please refer to their website: [email or web address].[email or web address]

## 2019-12-31 NOTE — Progress Notes (Signed)
Physical Therapy Treatment Patient Details Name: Ian Mcneil MRN: [id number] DOB: 04/29/1966 Today's Date: 12/31/2019    History of Present Illness 54 year old male admitted as transfer form [facility] ED on 2/19. PMH includes DM II (uncontrolled with A1C of 11) and HTN but had been taking no medications    PT Comments    Patient was in [facility] chair when PT entered room Explained purpose of this session. He was able to ambulate for 330 feet and monitored for O2 the entire session. He does desat but to 87 on RA- and had to reapply 1 LPM, Nash for recover to >94%- ambulated 330 feet and monitored the entire time. Reminded about IS and Flutter valve unit, and he is to continue this at home- as well as LE strengthening as provided on HEP with theraband. Should continue to benefit from PT while hospitalized for optimal functional outcomes  Follow Up Recommendations  No PT follow up     Equipment Recommendations       Recommendations for Other Services       Precautions / Restrictions Precautions Precautions: Other (comment) Precaution Comments: BP runs low- MONITOR O2 sats closely- tends to desat on RA with exertion Restrictions Weight Bearing Restrictions: No    Mobility  Bed Mobility Overal bed mobility: Independent             General bed mobility comments: Patient was lying in bed  Transfers Overall transfer level: Independent Equipment used: None                Ambulation/Gait Ambulation/Gait assistance: Supervision Gait Distance (Feet): 330 Feet Assistive device: None Gait Pattern/deviations: WFL(Within Functional Limits)   Gait velocity interpretation: >2.62 ft/sec, indicative of community ambulatory     Stairs             Wheelchair Mobility    Modified Rankin (Stroke Patients Only)       Balance Overall balance assessment: No apparent balance deficits (not formally assessed)                                          Cognition  Arousal/Alertness: Awake/alert Behavior During Therapy: WFL for tasks assessed/performed Overall Cognitive Status: Within Functional Limits for tasks assessed                                 General Comments: He does speak /understand simple English- but Stratus translator is in his room Spanish is his primary language. He does cough and frequently rubs his chest, but states not pain, just "tight"      Exercises Total Joint Exercises Ankle Circles/Pumps: AROM;Seated Short Arc Quad: AROM;Seated Other Exercises Other Exercises: IS and Flutter Valve Unit practice, 10 reps each, and reminded to perform hourly Other Exercises: Reminded to perform strengthening ex , has printed sheet and theraband in room    General Comments        Pertinent Vitals/Pain Pain Assessment: No/denies pain    Home Living Family/patient expects to be discharged to:: Private residence                    Prior Function            PT Goals (current goals can now be found in the care plan section) Acute Rehab PT Goals Patient Stated Goal: to  go home PT Goal Formulation: With patient Time For Goal Achievement: 01/11/20 Potential to Achieve Goals: Good Progress towards PT goals: Progressing toward goals    Frequency    Min 4X/week      PT Plan      Co-evaluation              AM-PAC PT "6 Clicks" Mobility   Outcome Measure  Help needed turning from your back to your side while in a flat bed without using bedrails?: None Help needed moving from lying on your back to sitting on the side of a flat bed without using bedrails?: None Help needed moving to and from a bed to a chair (including a wheelchair)?: None Help needed standing up from a chair using your arms (e.g., wheelchair or bedside chair)?: None Help needed to walk in hospital room?: A Little Help needed climbing 3-5 steps with a railing? : A Little 6 Click Score: 22    End of Session   Activity Tolerance:  Patient tolerated treatment well Patient left: in chair Nurse Communication: Mobility status PT Visit Diagnosis: Muscle weakness (generalized) (M62.81)     Time: 1000-1037 PT Time Calculation (min) (ACUTE ONLY): 37 min  Charges:  $Gait Training: 8-22 mins $Therapeutic Exercise: 8-22 mins                     Rollen Sox, PT # [phone] CGV cell   Casandra Doffing 12/31/2019, 2:11 PM

## 2019-12-31 NOTE — TOC Progression Note (Addendum)
Transition of Care [facility]) - Progression Note    Patient Details  Name: Ian Mcneil MRN: [id number] Date of Birth: [birth year]  [facility] CM/SW Contact  Golda Acre, RN Phone Number: 12/31/2019, 2:49 PM  Clinical Narrative:     adapt will provide the o2 wife name and number given.  Expected Discharge Plan: Home/Self Care Barriers to Discharge: Continued Medical Work up  Expected Discharge Plan and Services Expected Discharge Plan: Home/Self Care   Discharge Planning Services: CM Consult Post Acute Care Choice: Durable Medical Equipment Living arrangements for the past 2 months: Single Family Home                 DME Arranged: Oxygen DME Agency: Patsy Lager Date DME Agency Contacted: 12/31/19 Time DME Agency Contacted: [phone] Representative spoke with at DME Agency: Morrie Sheldon             Social Determinants of Health (SDOH) Interventions    Readmission Risk Interventions No flowsheet data found.

## 2019-12-31 NOTE — Progress Notes (Signed)
Received diabetes coordinator consult for 2/24.Marland Kitchen Staff RNs have been working with patient on insulin administration and patient is doing well. TOC working with patient on getting a PCP and making sure he can get his medications for discharge. Inpatient diabetes coordinator spoke with patient on the phone on 2/22 in regards to HgbA1C and insulin administration, health insurance and PCP.   Smith Mince RN BSN CDE Diabetes Coordinator Pager: [phone]  8am-5pm

## 2021-01-29 IMAGING — CR DG CHEST 2V
2 series · 2 of 2 positions shown · non-contrast
Comparison: None.

CLINICAL DATA: Chest pain and cough.

EXAM:
CHEST - 2 VIEW

[chest pa]
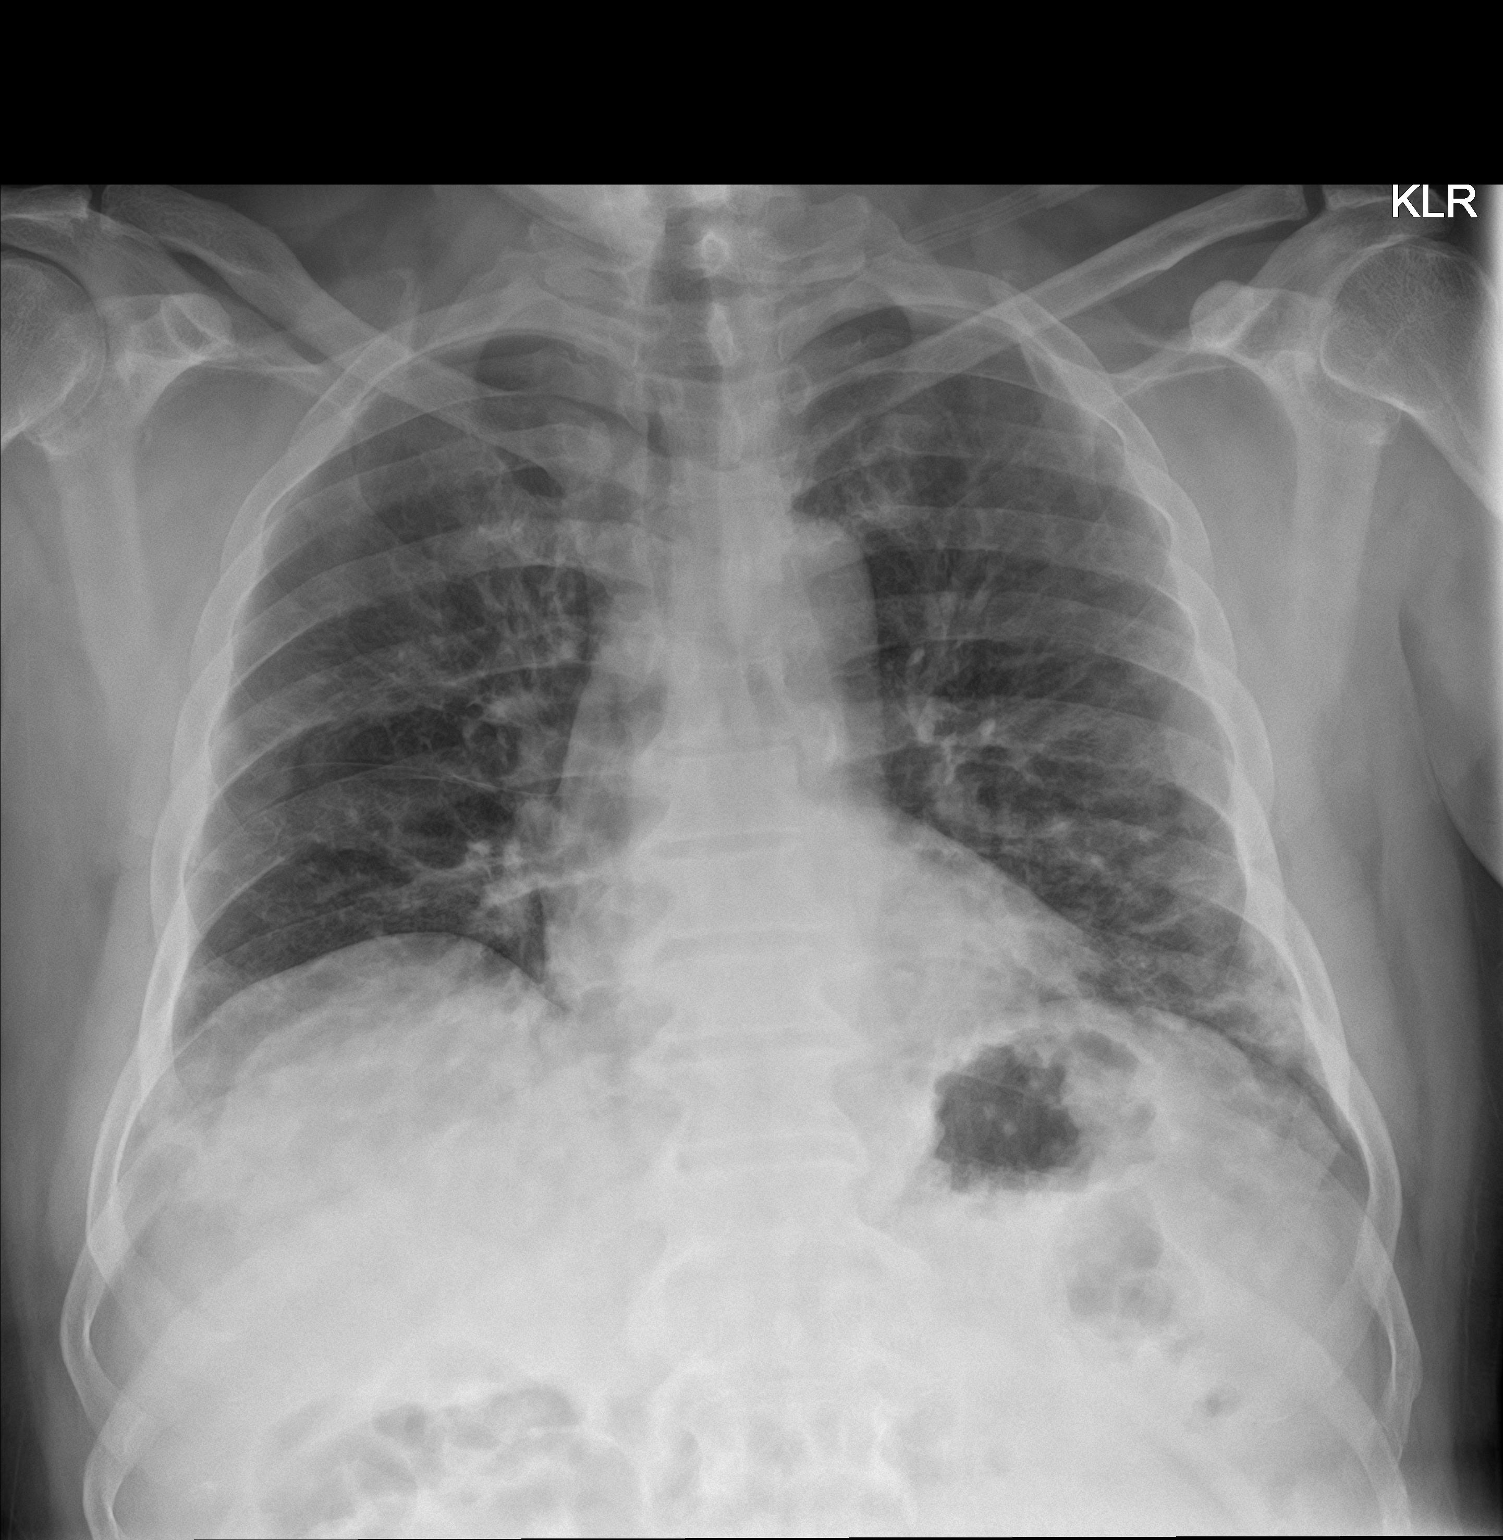

[chest lat]
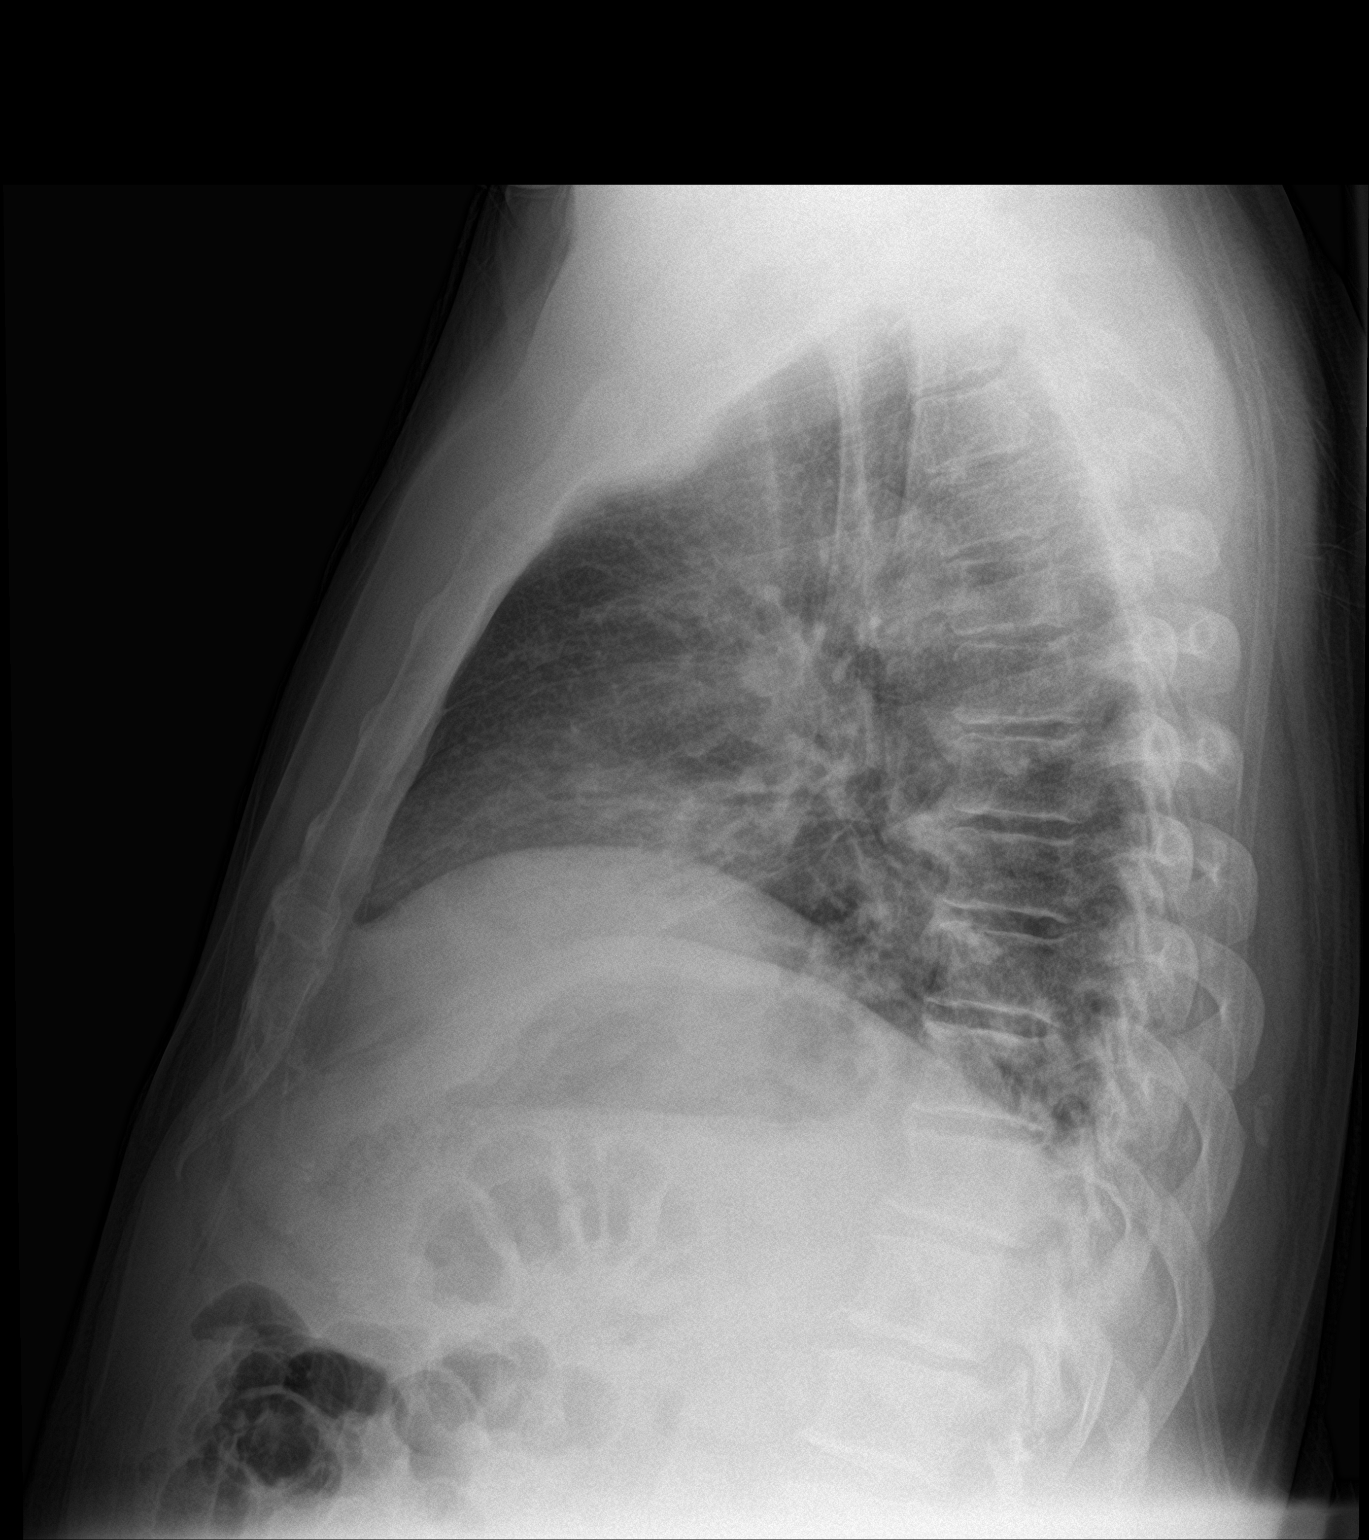

[2 of 2 positions shown; findings below may reference images not displayed]

FINDINGS: Lung volumes are low crowding of bronchovascular structures. Patchy
airspace disease is present in the lung bases. Heart size is normal.
No pneumothorax or pleural fluid. No acute or focal bony
abnormality.
IMPRESSION: Patchy bibasilar airspace disease could be due to atelectasis or
pneumonia.
